# Patient Record
Sex: Female | Born: 1950
Health system: Southern US, Community
[De-identification: ages and names within clinical notes are randomized; demographics above are authoritative.]

## PROBLEM LIST (undated history)

## (undated) DIAGNOSIS — K900 Celiac disease: Secondary | ICD-10-CM

## (undated) DIAGNOSIS — E079 Disorder of thyroid, unspecified: Secondary | ICD-10-CM

---

## 2010-05-01 ENCOUNTER — Encounter: Admission: RE | Admit: 2010-05-01 | Discharge: 2010-05-01 | Payer: Self-pay | Admitting: *Deleted

## 2013-01-23 ENCOUNTER — Emergency Department (HOSPITAL_COMMUNITY)
Admission: EM | Admit: 2013-01-23 | Discharge: 2013-01-23 | Disposition: A | Discharge status: Home / Self Care | Payer: No Typology Code available for payment source | Source: Home / Self Care

## 2013-01-23 ENCOUNTER — Encounter (HOSPITAL_COMMUNITY): Payer: Self-pay | Admitting: Emergency Medicine

## 2013-01-23 DIAGNOSIS — Z23 Encounter for immunization: Secondary | ICD-10-CM

## 2013-01-23 DIAGNOSIS — IMO0002 Reserved for concepts with insufficient information to code with codable children: Secondary | ICD-10-CM

## 2013-01-23 DIAGNOSIS — T148XXA Other injury of unspecified body region, initial encounter: Secondary | ICD-10-CM

## 2013-01-23 HISTORY — DX: Disorder of thyroid, unspecified: E07.9

## 2013-01-23 HISTORY — DX: Celiac disease: K90.0

## 2013-01-23 MED ORDER — SODIUM BICARBONATE 4 % IV SOLN
INTRAVENOUS | Status: AC
  Filled 2013-01-23: qty 5

## 2013-01-23 MED ORDER — TETANUS-DIPHTH-ACELL PERTUSSIS 5-2.5-18.5 LF-MCG/0.5 IM SUSP
0.5000 mL | Freq: Once | INTRAMUSCULAR | Status: AC
  Administered 2013-01-23: 0.5 mL via INTRAMUSCULAR

## 2013-01-23 MED ORDER — HYDROCODONE-ACETAMINOPHEN 5-325 MG PO TABS: ORAL_TABLET | ORAL | Status: DC

## 2013-01-23 MED ORDER — TETANUS-DIPHTH-ACELL PERTUSSIS 5-2.5-18.5 LF-MCG/0.5 IM SUSP
INTRAMUSCULAR | Status: AC
  Filled 2013-01-23: qty 0.5

## 2013-01-23 NOTE — ED Notes (Signed)
Pt c/o straight laceration to left thumb onset 40 minutes ago Reports she was cleaning her blender when she cut her thumb w/the blade Pt is still bleeding; last tetanus unknown; she believes it could be w/in 5 yrs Alert w/no signs of acute distress... Pt soaking thumb in NS w/betadine.  Granddaughter in room w/pt.

## 2013-01-23 NOTE — ED Provider Notes (Signed)
Chief Complaint:   Chief Complaint  Patient presents with  . Extremity Laceration    History of Present Illness:   Lindsay Munoz is a 62-year-old female who lacerated her left thumb today on an emergent bladder wall trying to make a special coughing. The laceration is located near the base of the thumb, between the MCP and IP joints. If bleeding profusely. She denies any numbness of the tip of the thumb. She's able to move all of her joints well. She cannot recall when her last shot was.  Review of Systems:  Other than noted above, the patient denies any of the following symptoms: Systemic:  No fever or chills. Musculoskeletal:  No joint pain or decreased range of motion. Neuro:  No numbness, tingling, or weakness.  PMFSH:  Past medical history, family history, social history, meds, and allergies were reviewed. She is allergic to penicillin. She takes Synthroid for autoimmune hypothyroidism and also has autoimmune celiac disease.  Physical Exam:   Vital signs:  BP 122/82  Pulse 60  Temp(Src) 98.2 F (36.8 C) (Oral)  Resp 16  SpO2 99% Ext:  There is a 1.5 cm laceration on the lateral aspect of the left thumb between the MCP joint and the IP joint. She is able to move all joints well and has intact sensation of the tip of the thumb with good capillary refill.  All other joints had a full ROM without pain.  Pulses were full.  Good capillary refill in all digits.  No edema. Neurological:  Alert and oriented.  No muscle weakness.  Sensation was intact to light touch.   Procedure: Verbal informed consent was obtained.  The patient was informed of the risks and benefits of the procedure and understands and accepts.  Identity of the patient was verified verbally and by wristband.   The laceration area described above was prepped with Betadine and saline  and anesthetized with a digital block with 5 mL of 2% Xylocaine without epinephrine as been buffered with 0.5 mL of sodium bicarbonate.  The  wound was then closed as follows:  Skin edges were approximated with 3 5-0 nylon sutures.  There were no immediate complications, and the patient tolerated the procedure well. The laceration was then cleansed, Bacitracin ointment was applied and a clean, dry pressure dressing was put on.   Course in Urgent Care Center:   Given a Tdap vaccine  Assessment:  The encounter diagnosis was Laceration.  Plan:   1.  The following meds were prescribed:   Discharge Medication List as of 01/23/2013  9:55 AM    START taking these medications   Details  HYDROcodone-acetaminophen (NORCO/VICODIN) 5-325 MG per tablet 1 to 2 tabs every 4 to 6 hours as needed for pain., Print       2.  The patient was instructed in wound care and pain control, and handouts were given. 3.  The patient was told to return in 14 days for suture removal or wound recheck or sooner if any sign of infection.     Reuben Likes, MD 01/23/13 1120

## 2013-02-13 ENCOUNTER — Emergency Department (INDEPENDENT_AMBULATORY_CARE_PROVIDER_SITE_OTHER)
Admission: EM | Admit: 2013-02-13 | Discharge: 2013-02-13 | Disposition: A | Discharge status: Home / Self Care | Payer: No Typology Code available for payment source | Source: Home / Self Care | Attending: Family Medicine | Admitting: Family Medicine

## 2013-02-13 ENCOUNTER — Encounter (HOSPITAL_COMMUNITY): Payer: Self-pay | Admitting: Emergency Medicine

## 2013-02-13 DIAGNOSIS — M659 Synovitis and tenosynovitis, unspecified: Secondary | ICD-10-CM

## 2013-02-13 DIAGNOSIS — M65839 Other synovitis and tenosynovitis, unspecified forearm: Secondary | ICD-10-CM

## 2013-02-13 NOTE — ED Notes (Signed)
Pt is here to have wound checked Seen here on 8/16 and some stitches placed... Cut her thumb w/a knife Sxs today include: swelling, tenderness, redness Denies: fevers, drainage Alert w/no signs of acute distress.

## 2013-02-13 NOTE — ED Provider Notes (Signed)
Lindsay Munoz is a 62 y.o. female who presents to Urgent Care today for left thumb lacerations followup. Patient suffered a laceration to the radial aspect of her left thumb at the DIP joint about 3 weeks ago. It was repaired in her clinic. Patient had her stitches removed 2 weeks after the injury. She notes some puffiness surrounding the area of the laceration. Additionally she notes IP pain when she gets her thumb on an object. She denies any radiating pain weakness or numbness. He feels well otherwise. She has no trouble with hand motion. She's not tried any medications.    Past Medical History  Diagnosis Date  . Celiac disease   . Thyroid disease    History  Substance Use Topics  . Smoking status: Never Smoker   . Smokeless tobacco: Not on file  . Alcohol Use: Yes   ROS as above Medications reviewed. No current facility-administered medications for this encounter.   Current Outpatient Prescriptions  Medication Sig Dispense Refill  . HYDROcodone-acetaminophen (NORCO/VICODIN) 5-325 MG per tablet 1 to 2 tabs every 4 to 6 hours as needed for pain.  20 tablet  0  . levothyroxine (SYNTHROID, LEVOTHROID) 88 MCG tablet Take 88 mcg by mouth daily before breakfast.        Exam:  BP 110/79  Pulse 67  Temp(Src) 98.5 F (36.9 C) (Oral)  Resp 16  SpO2 98% Gen: Well NAD LEFT THUMB: Immature appearing scar on the radial aspect of her IP. The joint is nontender with normal motion capillary refill and sensation are intact distally. Skin slightly puffy and slightly erythematous but not particularly tender.  No pus.   No results found for this or any previous visit (from the past 24 hour(s)). No results found.  Assessment and Plan: 62 y.o. female with maturing scar with mild synovitis of the left thumb IP joint.  No signs of infection.  Reassurance and watchful waiting.  Vitamin E. for scar maturation.  Followup with hand therapy if not improving Discussed warning signs or symptoms.  Please see discharge instructions. Patient expresses understanding.      Rodolph Bong, MD 02/13/13 [phone]

## 2013-07-16 ENCOUNTER — Telehealth: Payer: Self-pay | Admitting: Hematology and Oncology

## 2013-07-16 ENCOUNTER — Telehealth: Payer: Self-pay | Admitting: Internal Medicine

## 2013-07-16 NOTE — Telephone Encounter (Signed)
LEFT MESSAGE FOR PATIENT TO RETURN CALL SCHEDULE NEW PATIENT APPT.

## 2013-07-16 NOTE — Telephone Encounter (Signed)
C/D 07/16/13 for appt. 07/23/13 °

## 2013-07-16 NOTE — Telephone Encounter (Signed)
S/W PT IN REF TO NP APPT. ON 07/23/13@1 :30 REFERRING DR Chalmers Cater DX-HIGH FERRITIN LEVELS MAILED NP PACKET

## 2013-07-23 ENCOUNTER — Ambulatory Visit: Payer: No Typology Code available for payment source

## 2013-07-23 ENCOUNTER — Ambulatory Visit (HOSPITAL_BASED_OUTPATIENT_CLINIC_OR_DEPARTMENT_OTHER): Payer: No Typology Code available for payment source | Admitting: Hematology and Oncology

## 2013-07-23 ENCOUNTER — Encounter: Payer: Self-pay | Admitting: Hematology and Oncology

## 2013-07-23 VITALS — BP 133/75 | HR 67 | Temp 98.1°F | Resp 18 | Ht 63.0 in | Wt 156.0 lb

## 2013-07-23 DIAGNOSIS — R5383 Other fatigue: Secondary | ICD-10-CM

## 2013-07-23 DIAGNOSIS — R5381 Other malaise: Secondary | ICD-10-CM

## 2013-07-23 DIAGNOSIS — E039 Hypothyroidism, unspecified: Secondary | ICD-10-CM

## 2013-07-24 DIAGNOSIS — R5383 Other fatigue: Secondary | ICD-10-CM | POA: Insufficient documentation

## 2013-07-24 NOTE — Progress Notes (Signed)
[facility] CONSULT NOTE  Patient Care Team: Provider Not In System as PCP - General  CHIEF COMPLAINTS/PURPOSE OF CONSULTATION:  High ferritin  HISTORY OF PRESENTING ILLNESS:  Lindsay Munoz 63 y.o. female is here because of high ferritin. This patient has background history diagnosis of celiac disease. The patient could not remember how she was diagnosed. She has been complaining of chronic fatigue. The patient has history of hypothyroidism and is on replacement therapy She has blood work checked per patient request, to have an iron level drawn and it came back at 199. She's been referred here for concern for high ferritin level. Serum iron, TIBC, transferrin and iron saturation were all within normal limits The patient does not take oral iron supplement. There were no family history of hemachromatosis.  MEDICAL HISTORY:  Past Medical History  Diagnosis Date  . Celiac disease   . Thyroid disease     SURGICAL HISTORY: History reviewed. No pertinent past surgical history.  SOCIAL HISTORY: History   Social History  . Marital Status: Single    Spouse Name: N/A    Number of Children: N/A  . Years of Education: N/A   Occupational History  . Not on file.   Social History Main Topics  . Smoking status: Never Smoker   . Smokeless tobacco: Never Used  . Alcohol Use: Yes  . Drug Use: No  . Sexual Activity: Yes    Birth Control/ Protection: Inserts   Other Topics Concern  . Not on file   Social History Narrative  . No narrative on file    FAMILY HISTORY: Family History  Problem Relation Age of Onset  . Cancer Mother     breast cancer  . Cancer Father     NHL  . Cancer Brother     lymphoma    ALLERGIES:  is allergic to penicillins.  MEDICATIONS:  Current Outpatient Prescriptions  Medication Sig Dispense Refill  . EVAMIST 1.53 MG/SPRAY transdermal spray       . HYDROcodone-acetaminophen (NORCO/VICODIN) 5-325 MG per tablet 1 to 2 tabs every 4  to 6 hours as needed for pain.  20 tablet  0  . levothyroxine (SYNTHROID, LEVOTHROID) 88 MCG tablet Take 88 mcg by mouth daily before breakfast.      . progesterone (PROMETRIUM) 100 MG capsule       . ADVAIR DISKUS 100-50 MCG/DOSE AEPB       . ZOVIRAX 5 %        No current facility-administered medications for this visit.    REVIEW OF SYSTEMS:   Constitutional: Denies fevers, chills or abnormal night sweats Eyes: Denies blurriness of vision, double vision or watery eyes Ears, nose, mouth, throat, and face: Denies mucositis or sore throat Respiratory: Denies cough, dyspnea or wheezes Cardiovascular: Denies palpitation, chest discomfort or lower extremity swelling Gastrointestinal:  Denies nausea, heartburn or change in bowel habits Skin: Denies abnormal skin rashes Lymphatics: Denies new lymphadenopathy or easy bruising Neurological:Denies numbness, tingling or new weaknesses Behavioral/Psych: Mood is stable, no new changes  All other systems were reviewed with the patient and are negative.  PHYSICAL EXAMINATION: ECOG PERFORMANCE STATUS: 0 - Asymptomatic  Filed Vitals:   07/23/13 1352  BP: 133/75  Pulse: 67  Temp: 98.1 F (36.7 C)  Resp: 18   Filed Weights   07/23/13 1352  Weight: 156 lb (70.761 kg)    GENERAL:alert, no distress and comfortable SKIN: skin color, texture, turgor are normal, no rashes or significant lesions EYES:  normal, conjunctiva are pink and non-injected, sclera clear OROPHARYNX:no exudate, no erythema and lips, buccal mucosa, and tongue normal  NECK: supple, thyroid normal size, non-tender, without nodularity LYMPH:  no palpable lymphadenopathy in the cervical, axillary or inguinal LUNGS: clear to auscultation and percussion with normal breathing effort HEART: regular rate & rhythm and no murmurs and no lower extremity edema ABDOMEN:abdomen soft, non-tender and normal bowel sounds Musculoskeletal:no cyanosis of digits and no clubbing  PSYCH: alert &  oriented x 3 with fluent speech NEURO: no focal motor/sensory deficits  LABORATORY DATA:  I have reviewed the data as listed above No results found for this basename: WBC, HGB, HCT, MCV, PLT   No results found for this basename: NA, K, CL, CO2    ASSESSMENT & PLAN:  #1 concerns for high ferritin According to her physician's lab, serum ferritin level of 199 is considered high. In general, I would consider ferritin level high if it's more than 300. The serum iron, iron saturation, transferrin and iron binding capacity are all within normal limits. I reassured the patient that she is does not have diagnosis of iron overload or hemochromatosis. I reassured the patient that she does not need to return and she does not require any further followup in the future.  All questions were answered. The patient knows to call the clinic with any problems, questions or concerns. I spent 30 minutes counseling the patient face to face. The total time spent in the appointment was 30 minutes and more than 50% was on counseling.     [facility], [place], MD 07/24/2013 3:16 PM

## 2015-01-10 ENCOUNTER — Other Ambulatory Visit: Payer: Self-pay | Admitting: Orthopedic Surgery

## 2015-02-06 ENCOUNTER — Encounter (HOSPITAL_BASED_OUTPATIENT_CLINIC_OR_DEPARTMENT_OTHER): Admission: RE | Payer: Self-pay | Source: Ambulatory Visit

## 2015-02-06 ENCOUNTER — Ambulatory Visit (HOSPITAL_BASED_OUTPATIENT_CLINIC_OR_DEPARTMENT_OTHER)
Admission: RE | Admit: 2015-02-06 | Payer: No Typology Code available for payment source | Source: Ambulatory Visit | Admitting: Orthopedic Surgery

## 2015-02-06 SURGERY — RELEASE, A1 PULLEY, FOR TRIGGER FINGER
Anesthesia: Monitor Anesthesia Care | Site: Thumb | Laterality: Right

## 2017-01-30 ENCOUNTER — Ambulatory Visit (INDEPENDENT_AMBULATORY_CARE_PROVIDER_SITE_OTHER): Payer: No Typology Code available for payment source | Admitting: Podiatry

## 2017-01-30 ENCOUNTER — Encounter: Payer: Self-pay | Admitting: Podiatry

## 2017-01-30 VITALS — BP 130/74 | HR 55 | Resp 16

## 2017-01-30 DIAGNOSIS — M2041 Other hammer toe(s) (acquired), right foot: Secondary | ICD-10-CM | POA: Diagnosis not present

## 2017-01-30 DIAGNOSIS — M779 Enthesopathy, unspecified: Secondary | ICD-10-CM | POA: Diagnosis not present

## 2017-01-30 DIAGNOSIS — M21619 Bunion of unspecified foot: Secondary | ICD-10-CM | POA: Diagnosis not present

## 2017-01-30 MED ORDER — DICLOFENAC SODIUM 75 MG PO TBEC: 75.0000 mg | DELAYED_RELEASE_TABLET | Freq: Two times a day (BID) | ORAL | 2 refills | Status: AC

## 2017-01-30 NOTE — Patient Instructions (Signed)

## 2017-01-30 NOTE — Progress Notes (Signed)
   Subjective:    Patient ID: Lindsay Munoz, female    DOB: [birth year], 66 y.o.   MRN: [id number]  HPI Chief Complaint  Patient presents with  . Foot Pain    Right foot; plantar forefoot-below 2nd toe; x1 yr  . Nail Problem    Left foot; great toe-medial side; Left foot 5th toe-pt stated, "Thinks might have nail fungus and wants to have checked; is taking Ciclopirox Topical Solution"      Review of Systems  Allergic/Immunologic: Positive for food allergies.  All other systems reviewed and are negative.      Objective:   Physical Exam        Assessment & Plan:

## 2017-01-30 NOTE — Progress Notes (Signed)
Subjective:    Patient ID: Lindsay Munoz, female   DOB: 66 y.o.   MRN: [id number]   HPI patient presents with inflammation around the right second metatarsal joint of approximate year duration and ingrown toenail deformity left hallux medial border that is painful along with thickness of the fifth nails bilateral    Review of Systems  All other systems reviewed and are negative.       Objective:  Physical Exam  Constitutional: She appears well-developed and well-nourished.  Cardiovascular: Intact distal pulses.   Pulmonary/Chest: Breath sounds normal.  Musculoskeletal: Normal range of motion.  Neurological: She is alert.  Skin: Skin is warm.  Nursing note and vitals reviewed.  neurovascular status found to be intact muscle strength was adequate range of motion within normal limits with patient noted to have inflammation pain around the second MPJ right and I noted incurvated left hallux medial border. The fifth nails are also slightly thickened but that appears to be related to position and they're mildly tender when pressed. Second MPJ has been sore and I also noted structural bunion deformity     Assessment:  Probable inflammatory capsulitis second MPJ right along with ingrown toenail deformity left and damage fifth nails bilateral along with structural bunion right       Plan:   H&P conditions reviewed and discussed x-rays but patient denies wanting x-rays at the current time. I went ahead and discussed treatment and options are somewhat limited due to her not wanting me to do any types of x-rays and today I did apply metatarsal pad to diminish pressure on the joint placed her on anti-inflammatory diclofenac 75 mg twice a day educated her on an ingrown toenail correction and probable capsulitis and we will schedule for treatment in the future when it works out timewise for her

## 2017-06-11 DIAGNOSIS — J209 Acute bronchitis, unspecified: Secondary | ICD-10-CM | POA: Diagnosis not present

## 2017-06-19 DIAGNOSIS — Z01419 Encounter for gynecological examination (general) (routine) without abnormal findings: Secondary | ICD-10-CM | POA: Diagnosis not present

## 2017-06-19 DIAGNOSIS — Z6827 Body mass index (BMI) 27.0-27.9, adult: Secondary | ICD-10-CM | POA: Diagnosis not present

## 2017-06-19 DIAGNOSIS — Z124 Encounter for screening for malignant neoplasm of cervix: Secondary | ICD-10-CM | POA: Diagnosis not present

## 2017-06-19 DIAGNOSIS — Z1231 Encounter for screening mammogram for malignant neoplasm of breast: Secondary | ICD-10-CM | POA: Diagnosis not present

## 2017-07-10 DIAGNOSIS — R7309 Other abnormal glucose: Secondary | ICD-10-CM | POA: Diagnosis not present

## 2017-07-31 DIAGNOSIS — F330 Major depressive disorder, recurrent, mild: Secondary | ICD-10-CM | POA: Diagnosis not present

## 2017-08-14 DIAGNOSIS — F330 Major depressive disorder, recurrent, mild: Secondary | ICD-10-CM | POA: Diagnosis not present

## 2017-09-04 DIAGNOSIS — F330 Major depressive disorder, recurrent, mild: Secondary | ICD-10-CM | POA: Diagnosis not present

## 2017-09-04 DIAGNOSIS — F411 Generalized anxiety disorder: Secondary | ICD-10-CM | POA: Diagnosis not present

## 2017-09-11 DIAGNOSIS — F411 Generalized anxiety disorder: Secondary | ICD-10-CM | POA: Diagnosis not present

## 2017-09-11 DIAGNOSIS — F330 Major depressive disorder, recurrent, mild: Secondary | ICD-10-CM | POA: Diagnosis not present

## 2017-10-07 DIAGNOSIS — L718 Other rosacea: Secondary | ICD-10-CM | POA: Diagnosis not present

## 2017-10-07 DIAGNOSIS — Z85828 Personal history of other malignant neoplasm of skin: Secondary | ICD-10-CM | POA: Diagnosis not present

## 2017-10-07 DIAGNOSIS — I788 Other diseases of capillaries: Secondary | ICD-10-CM | POA: Diagnosis not present

## 2017-10-07 DIAGNOSIS — L918 Other hypertrophic disorders of the skin: Secondary | ICD-10-CM | POA: Diagnosis not present

## 2017-10-21 DIAGNOSIS — E039 Hypothyroidism, unspecified: Secondary | ICD-10-CM | POA: Diagnosis not present

## 2017-10-28 DIAGNOSIS — E039 Hypothyroidism, unspecified: Secondary | ICD-10-CM | POA: Diagnosis not present

## 2017-11-12 DIAGNOSIS — F330 Major depressive disorder, recurrent, mild: Secondary | ICD-10-CM | POA: Diagnosis not present

## 2017-11-28 DIAGNOSIS — R5383 Other fatigue: Secondary | ICD-10-CM | POA: Diagnosis not present

## 2017-12-02 DIAGNOSIS — Z780 Asymptomatic menopausal state: Secondary | ICD-10-CM | POA: Diagnosis not present

## 2017-12-02 DIAGNOSIS — N951 Menopausal and female climacteric states: Secondary | ICD-10-CM | POA: Diagnosis not present

## 2017-12-08 DIAGNOSIS — F330 Major depressive disorder, recurrent, mild: Secondary | ICD-10-CM | POA: Diagnosis not present

## 2018-01-01 DIAGNOSIS — F330 Major depressive disorder, recurrent, mild: Secondary | ICD-10-CM | POA: Diagnosis not present

## 2018-01-13 DIAGNOSIS — M25521 Pain in right elbow: Secondary | ICD-10-CM | POA: Diagnosis not present

## 2018-01-13 DIAGNOSIS — M20012 Mallet finger of left finger(s): Secondary | ICD-10-CM | POA: Diagnosis not present

## 2018-01-13 DIAGNOSIS — M7711 Lateral epicondylitis, right elbow: Secondary | ICD-10-CM | POA: Diagnosis not present

## 2018-01-14 DIAGNOSIS — F330 Major depressive disorder, recurrent, mild: Secondary | ICD-10-CM | POA: Diagnosis not present

## 2018-01-22 ENCOUNTER — Encounter: Payer: Self-pay | Admitting: Podiatry

## 2018-01-22 ENCOUNTER — Ambulatory Visit (INDEPENDENT_AMBULATORY_CARE_PROVIDER_SITE_OTHER): Payer: Medicare Other | Admitting: Podiatry

## 2018-01-22 DIAGNOSIS — M2041 Other hammer toe(s) (acquired), right foot: Secondary | ICD-10-CM | POA: Diagnosis not present

## 2018-01-22 DIAGNOSIS — L603 Nail dystrophy: Secondary | ICD-10-CM | POA: Diagnosis not present

## 2018-01-22 DIAGNOSIS — M779 Enthesopathy, unspecified: Secondary | ICD-10-CM | POA: Diagnosis not present

## 2018-01-22 DIAGNOSIS — M21619 Bunion of unspecified foot: Secondary | ICD-10-CM | POA: Diagnosis not present

## 2018-01-22 DIAGNOSIS — L608 Other nail disorders: Secondary | ICD-10-CM | POA: Diagnosis not present

## 2018-01-22 NOTE — Progress Notes (Signed)
Subjective: 67 year old female presents the office today primarily for concerns of toenail discoloration as well as thickening to both of her fifth digit toenails.  She states she occasionally with ingrown toenail to the big toe on the left side which is been a chronic issue but she denies any redness or drainage or any swelling.  Also she also saw Dr. Paulla Dolly previously for pain to her right foot.  Since then she also followed up with orthopedics.  She was having pain to the arch of her foot as well as in the ball of her foot.  She states that she was told not to have a steroid injection.  She did change her shoes and she did purchase an [employer] cloud shoe and when she got this her pain resolved and she is currently not expensing any pain to her right foot.  No recent injury no numbness or tingling. Denies any systemic complaints such as fevers, chills, nausea, vomiting. No acute changes since last appointment, and no other complaints at this time.   Objective: AAO x3, NAD DP/PT pulses palpable bilaterally, CRT less than 3 seconds The nails appear to be hypertrophic, dystrophic, discolored mostly to the fifth digit toenails however all the nails are slightly discolored yellow to brown discoloration.  There is mild tenderness palpation of the fifth toenails given the thickening but there is no edema, erythema, drainage or pus there is no clinical signs of infection noted today.  Mild incurvation on both medial lateral corners of bilateral hallux nails without any signs or symptoms of infection no pain.  The nails are uneven. Bunion deformities present bilaterally as well as slight medial deviation of the second toe upon weightbearing.  There is no area tenderness to palpation of this area there is no pain or crepitation with MPJ range of motion.  No pain no plantar fascial, Achilles tendon.  No edema, erythema. No open lesions or pre-ulcerative lesions.  No pain with calf compression, swelling, warmth,  erythema  Assessment: Onychodystrophy, likely onychomycosis with ingrowing toenails; resolved right foot pain.  Plan: -All treatment options discussed with the patient including all alternatives, risks, complications.  -We long discussion today in regards to treatment of her foot pain.  Her pain seems to be resolved the change in shoes we discussed continue with supportive shoes.  We will hold off on x-rays today at her request.  We will continue to monitor. -Today we discussed treatment options for likely nail fungus however before proceeding with definitive treatment I did culture the nails today.  Debrided all the nails without any complications I sent this for culture/pathology to evaluate for nail fungus. Specimen was given to Cranford Mon, CMA.  -Patient encouraged to call the office with any questions, concerns, change in symptoms.   Trula Slade DPM

## 2018-01-22 NOTE — Addendum Note (Signed)
Addended by: Graceann Congress D on: 01/22/2018 12:11 PM   Modules accepted: Orders

## 2018-01-30 DIAGNOSIS — E039 Hypothyroidism, unspecified: Secondary | ICD-10-CM | POA: Diagnosis not present

## 2018-01-30 DIAGNOSIS — E559 Vitamin D deficiency, unspecified: Secondary | ICD-10-CM | POA: Diagnosis not present

## 2018-01-30 DIAGNOSIS — H539 Unspecified visual disturbance: Secondary | ICD-10-CM | POA: Diagnosis not present

## 2018-01-30 DIAGNOSIS — H9192 Unspecified hearing loss, left ear: Secondary | ICD-10-CM | POA: Diagnosis not present

## 2018-01-30 DIAGNOSIS — Z0000 Encounter for general adult medical examination without abnormal findings: Secondary | ICD-10-CM | POA: Diagnosis not present

## 2018-01-30 DIAGNOSIS — Z136 Encounter for screening for cardiovascular disorders: Secondary | ICD-10-CM | POA: Diagnosis not present

## 2018-01-30 DIAGNOSIS — R7301 Impaired fasting glucose: Secondary | ICD-10-CM | POA: Diagnosis not present

## 2018-01-30 DIAGNOSIS — Z79899 Other long term (current) drug therapy: Secondary | ICD-10-CM | POA: Diagnosis not present

## 2018-02-06 DIAGNOSIS — Z23 Encounter for immunization: Secondary | ICD-10-CM | POA: Diagnosis not present

## 2018-02-10 DIAGNOSIS — M25521 Pain in right elbow: Secondary | ICD-10-CM | POA: Diagnosis not present

## 2018-02-10 DIAGNOSIS — M20012 Mallet finger of left finger(s): Secondary | ICD-10-CM | POA: Diagnosis not present

## 2018-02-11 ENCOUNTER — Telehealth: Payer: Self-pay | Admitting: *Deleted

## 2018-02-11 NOTE — Telephone Encounter (Signed)
Called and left a message for the patient to call me back about the nail culture at [phone]. Lindsay Munoz

## 2018-02-13 DIAGNOSIS — M542 Cervicalgia: Secondary | ICD-10-CM | POA: Diagnosis not present

## 2018-02-19 ENCOUNTER — Telehealth: Payer: Self-pay | Admitting: Podiatry

## 2018-02-19 NOTE — Telephone Encounter (Signed)
Pt was returning a phone call to [name]. Please give her a call back

## 2018-02-19 NOTE — Telephone Encounter (Signed)
Pt never received her test results.

## 2018-02-20 ENCOUNTER — Telehealth: Payer: Self-pay | Admitting: *Deleted

## 2018-02-20 NOTE — Telephone Encounter (Signed)
Called and left a message for the patient to call me back. Lattie Haw

## 2018-02-23 DIAGNOSIS — F330 Major depressive disorder, recurrent, mild: Secondary | ICD-10-CM | POA: Diagnosis not present

## 2018-03-05 DIAGNOSIS — R194 Change in bowel habit: Secondary | ICD-10-CM | POA: Diagnosis not present

## 2018-03-05 DIAGNOSIS — K600 Acute anal fissure: Secondary | ICD-10-CM | POA: Diagnosis not present

## 2018-03-05 DIAGNOSIS — K5900 Constipation, unspecified: Secondary | ICD-10-CM | POA: Diagnosis not present

## 2018-03-05 DIAGNOSIS — K625 Hemorrhage of anus and rectum: Secondary | ICD-10-CM | POA: Diagnosis not present

## 2018-03-25 DIAGNOSIS — L918 Other hypertrophic disorders of the skin: Secondary | ICD-10-CM | POA: Diagnosis not present

## 2018-03-25 DIAGNOSIS — F330 Major depressive disorder, recurrent, mild: Secondary | ICD-10-CM | POA: Diagnosis not present

## 2018-03-25 DIAGNOSIS — Z85828 Personal history of other malignant neoplasm of skin: Secondary | ICD-10-CM | POA: Diagnosis not present

## 2018-03-25 DIAGNOSIS — L738 Other specified follicular disorders: Secondary | ICD-10-CM | POA: Diagnosis not present

## 2018-03-25 DIAGNOSIS — L821 Other seborrheic keratosis: Secondary | ICD-10-CM | POA: Diagnosis not present

## 2018-03-31 DIAGNOSIS — D259 Leiomyoma of uterus, unspecified: Secondary | ICD-10-CM | POA: Diagnosis not present

## 2018-03-31 DIAGNOSIS — R1032 Left lower quadrant pain: Secondary | ICD-10-CM | POA: Diagnosis not present

## 2018-04-01 DIAGNOSIS — F419 Anxiety disorder, unspecified: Secondary | ICD-10-CM | POA: Diagnosis not present

## 2018-04-03 DIAGNOSIS — Z23 Encounter for immunization: Secondary | ICD-10-CM | POA: Diagnosis not present

## 2018-04-08 DIAGNOSIS — F330 Major depressive disorder, recurrent, mild: Secondary | ICD-10-CM | POA: Diagnosis not present

## 2018-04-28 DIAGNOSIS — E039 Hypothyroidism, unspecified: Secondary | ICD-10-CM | POA: Diagnosis not present

## 2018-05-04 DIAGNOSIS — F330 Major depressive disorder, recurrent, mild: Secondary | ICD-10-CM | POA: Diagnosis not present

## 2018-05-05 DIAGNOSIS — E039 Hypothyroidism, unspecified: Secondary | ICD-10-CM | POA: Diagnosis not present

## 2018-05-14 DIAGNOSIS — M20012 Mallet finger of left finger(s): Secondary | ICD-10-CM | POA: Diagnosis not present

## 2018-05-14 DIAGNOSIS — M13842 Other specified arthritis, left hand: Secondary | ICD-10-CM | POA: Diagnosis not present

## 2018-05-14 DIAGNOSIS — M25521 Pain in right elbow: Secondary | ICD-10-CM | POA: Diagnosis not present

## 2018-05-27 DIAGNOSIS — M20012 Mallet finger of left finger(s): Secondary | ICD-10-CM | POA: Diagnosis not present

## 2018-06-29 DIAGNOSIS — Z6826 Body mass index (BMI) 26.0-26.9, adult: Secondary | ICD-10-CM | POA: Diagnosis not present

## 2018-06-29 DIAGNOSIS — Z01419 Encounter for gynecological examination (general) (routine) without abnormal findings: Secondary | ICD-10-CM | POA: Diagnosis not present

## 2018-06-29 DIAGNOSIS — Z1231 Encounter for screening mammogram for malignant neoplasm of breast: Secondary | ICD-10-CM | POA: Diagnosis not present

## 2018-07-03 ENCOUNTER — Encounter: Payer: Self-pay | Admitting: Podiatry

## 2018-07-03 ENCOUNTER — Ambulatory Visit (INDEPENDENT_AMBULATORY_CARE_PROVIDER_SITE_OTHER): Payer: Medicare Other | Admitting: Podiatry

## 2018-07-03 DIAGNOSIS — M21619 Bunion of unspecified foot: Secondary | ICD-10-CM

## 2018-07-03 NOTE — Progress Notes (Signed)
This patient presents to the office with severe pain noted at the bunion left big toe.  She says she has developed a painful skin lesion at the site of the bunion.  She says this skin lesion has increased in severity the last 2 months.  She says certain shoes aggrevate this skin lesion.  She presents to the office for evaluation and treatment.  General Appearance  Alert, conversant and in no acute stress.  Vascular  Dorsalis pedis and posterior tibial  pulses are palpable  bilaterally.  Capillary return is within normal limits  bilaterally. Temperature is within normal limits  bilaterally.  Neurologic  Senn-Weinstein monofilament wire test within normal limits  bilaterally. Muscle power within normal limits bilaterally.  Nails Thick disfigured discolored nails with subungual debris  from hallux to fifth toes bilaterally. No evidence of bacterial infection or drainage bilaterally.  Orthopedic  No limitations of motion  feet .  No crepitus or effusions noted.  No bony pathology or digital deformities noted. HAV 1st MPJ  B/L.Hammer toe 2nd  B/L.  Skin  normotropic skin  noted bilaterally.  No signs of infections or ulcers noted.  Porokeratosis at dorsomedial exostosis left big toe bunion.  Porokeratosis  HAV 1st MPJ  B/L  ROV>  Debride porokeratosis.  Discussed footgear with this patient.  Recommended usage of pumice stone.  RTC prn   Gardiner Barefoot DPM

## 2018-07-06 DIAGNOSIS — Z85828 Personal history of other malignant neoplasm of skin: Secondary | ICD-10-CM | POA: Diagnosis not present

## 2018-07-06 DIAGNOSIS — D2239 Melanocytic nevi of other parts of face: Secondary | ICD-10-CM | POA: Diagnosis not present

## 2018-07-06 DIAGNOSIS — L918 Other hypertrophic disorders of the skin: Secondary | ICD-10-CM | POA: Diagnosis not present

## 2018-07-14 DIAGNOSIS — Z780 Asymptomatic menopausal state: Secondary | ICD-10-CM | POA: Diagnosis not present

## 2018-07-17 DIAGNOSIS — F411 Generalized anxiety disorder: Secondary | ICD-10-CM | POA: Diagnosis not present

## 2018-08-11 ENCOUNTER — Ambulatory Visit: Payer: Medicare Other | Admitting: Podiatry

## 2018-08-13 DIAGNOSIS — K580 Irritable bowel syndrome with diarrhea: Secondary | ICD-10-CM | POA: Diagnosis not present

## 2018-08-13 DIAGNOSIS — K641 Second degree hemorrhoids: Secondary | ICD-10-CM | POA: Diagnosis not present

## 2018-08-14 ENCOUNTER — Encounter: Payer: Self-pay | Admitting: Podiatry

## 2018-08-14 ENCOUNTER — Ambulatory Visit (INDEPENDENT_AMBULATORY_CARE_PROVIDER_SITE_OTHER): Payer: Medicare Other | Admitting: Podiatry

## 2018-08-14 DIAGNOSIS — L84 Corns and callosities: Secondary | ICD-10-CM | POA: Diagnosis not present

## 2018-08-14 NOTE — Progress Notes (Signed)
This patient presents to the office with severe pain noted at the  The outside of her fifth toe right foot.  She says this area becomes painful.  She says she aggravates the toe wearing her shoes.  She points to the site of hyperkeratotic lesion.  She presents to the office for evaluation and treatment.  General Appearance  Alert, conversant and in no acute stress.  Vascular  Dorsalis pedis and posterior tibial  pulses are palpable  bilaterally.  Capillary return is within normal limits  bilaterally. Temperature is within normal limits  bilaterally.  Neurologic  Senn-Weinstein monofilament wire test within normal limits  bilaterally. Muscle power within normal limits bilaterally.  Nails Thick disfigured discolored nails with subungual debris  from hallux to fifth toes bilaterally. No evidence of bacterial infection or drainage bilaterally.  Orthopedic  No limitations of motion  feet .  No crepitus or effusions noted.  No bony pathology or digital deformities noted. HAV 1st MPJ  B/L.Hammer toe 2nd  B/L.  Skin  normotropic skin  noted bilaterally.  No signs of infections or ulcers noted.  Porokeratosis at dorsomedial exostosis left big toe bunion asymptomatic.  Pain at outside fifth toe right foot.  Corn fifth toe right foot.  ROV>  Debride porokeratosis.  Padding dispensed.    RTC prn   Gardiner Barefoot DPM

## 2018-08-15 ENCOUNTER — Ambulatory Visit: Payer: Medicare Other | Admitting: Podiatry

## 2018-08-18 DIAGNOSIS — N950 Postmenopausal bleeding: Secondary | ICD-10-CM | POA: Diagnosis not present

## 2018-08-24 ENCOUNTER — Other Ambulatory Visit: Payer: Self-pay

## 2018-08-24 ENCOUNTER — Ambulatory Visit (INDEPENDENT_AMBULATORY_CARE_PROVIDER_SITE_OTHER): Payer: Medicare Other

## 2018-08-24 ENCOUNTER — Ambulatory Visit (INDEPENDENT_AMBULATORY_CARE_PROVIDER_SITE_OTHER): Payer: Medicare Other | Admitting: Podiatry

## 2018-08-24 ENCOUNTER — Encounter: Payer: Self-pay | Admitting: Podiatry

## 2018-08-24 DIAGNOSIS — M79671 Pain in right foot: Secondary | ICD-10-CM

## 2018-08-24 DIAGNOSIS — T148XXA Other injury of unspecified body region, initial encounter: Secondary | ICD-10-CM

## 2018-08-25 ENCOUNTER — Other Ambulatory Visit: Payer: Self-pay | Admitting: Podiatry

## 2018-08-25 DIAGNOSIS — T148XXA Other injury of unspecified body region, initial encounter: Secondary | ICD-10-CM

## 2018-08-25 NOTE — Progress Notes (Signed)
Subjective: 68 year old female presents the office today for concerns of right foot pain which is been ongoing for the last 3 weeks.  She states that she recently dropped a cutting board on top of her foot.  Since then she is having achy pain to the top of her foot she points more to the dorsal lateral aspect of the foot.  She said no recent treatment.  She states that wearing shoes without support and that were noticed to have caused increased pain when she started wearing her hiking boots this felt better. Denies any systemic complaints such as fevers, chills, nausea, vomiting. No acute changes since last appointment, and no other complaints at this time.   Objective: AAO x3, NAD DP/PT pulses palpable bilaterally, CRT less than 3 seconds There is no significant edema to the right foot there is no open sores.  There is tenderness to palpation directly the fourth metatarsal.  There is no other areas of point tenderness.  Flexor, extensor tendons all appear to be intact. No open lesions or pre-ulcerative lesions.  No pain with calf compression, swelling, warmth, erythema  Assessment: Bone contusion right fourth metatarsal  Plan: -All treatment options discussed with the patient including all alternatives, risks, complications.  -X-rays were obtained and reviewed.  There is some radiolucency, swelling present of the fourth metatarsal consistent with either bone bruise versus healing fracture. -Given her pain as well as radiological findings of medical history, surgical shoe.  This was dispensed today.  Ice and elevation.  Anti-inflammatories as needed. -Patient encouraged to call the office with any questions, concerns, change in symptoms.   Return in about 3 weeks (around 09/14/2018).  Repeat x-rays appointment  Trula Slade DPM

## 2018-08-26 ENCOUNTER — Ambulatory Visit: Payer: Medicare Other | Admitting: Podiatry

## 2018-08-27 DIAGNOSIS — N950 Postmenopausal bleeding: Secondary | ICD-10-CM | POA: Diagnosis not present

## 2018-08-27 DIAGNOSIS — Z780 Asymptomatic menopausal state: Secondary | ICD-10-CM | POA: Diagnosis not present

## 2018-09-14 ENCOUNTER — Encounter: Payer: Self-pay | Admitting: Podiatry

## 2018-09-14 ENCOUNTER — Ambulatory Visit (INDEPENDENT_AMBULATORY_CARE_PROVIDER_SITE_OTHER): Payer: Medicare Other

## 2018-09-14 ENCOUNTER — Other Ambulatory Visit: Payer: Self-pay

## 2018-09-14 ENCOUNTER — Ambulatory Visit (INDEPENDENT_AMBULATORY_CARE_PROVIDER_SITE_OTHER): Payer: Medicare Other | Admitting: Podiatry

## 2018-09-14 VITALS — Temp 98.2°F

## 2018-09-14 DIAGNOSIS — T148XXA Other injury of unspecified body region, initial encounter: Secondary | ICD-10-CM

## 2018-09-14 DIAGNOSIS — L600 Ingrowing nail: Secondary | ICD-10-CM

## 2018-09-14 MED ORDER — DICLOFENAC SODIUM 1 % TD GEL: 2.0000 g | Freq: Four times a day (QID) | TRANSDERMAL | 2 refills | Status: DC

## 2018-09-15 DIAGNOSIS — L821 Other seborrheic keratosis: Secondary | ICD-10-CM | POA: Diagnosis not present

## 2018-09-15 DIAGNOSIS — Z85828 Personal history of other malignant neoplasm of skin: Secondary | ICD-10-CM | POA: Diagnosis not present

## 2018-09-17 DIAGNOSIS — F411 Generalized anxiety disorder: Secondary | ICD-10-CM | POA: Diagnosis not present

## 2018-09-17 NOTE — Progress Notes (Signed)
Subjective: 68 year old female presents the office today for follow evaluation of right foot pain.  She said overall is doing okay.  When she wears hiking boots she has no pain but when she tries to wear small heel or very flexible shoe she gets discomfort.  No increase in swelling and no redness or warmth.  No new injury since I last saw her.  Also she wants me to look at her left big toe.  She usually gets pedicures but given the recent pandemic she cannot get them and she wants me to check to see if she is getting ingrown toenail.  She has no pain to the toe and she denies any edema, erythema or any drainage or pus or any signs of infection. Denies any systemic complaints such as fevers, chills, nausea, vomiting. No acute changes since last appointment, and no other complaints at this time.   Objective: AAO x3, NAD DP/PT pulses palpable bilaterally, CRT less than 3 seconds Minimal incurvation present left hallux toenail on the medial aspect of where she had concerns.  There is no edema, erythema, increase in warmth.  There is no drainage or pus identified at this time. At this time there is minimal discomfort to the right foot.  Majority tenderness is on the fourth metatarsal area there is no pain to vibratory sensation to the foot there is no other area pinpoint tenderness.  There is no significant edema, erythema. No open lesions or pre-ulcerative lesions.  No pain with calf compression, swelling, warmth, erythema  Assessment: Right foot bone contusion, left mild ingrown toenail  Plan: -All treatment options discussed with the patient including all alternatives, risks, complications.  -Repeat x-rays were obtained and reviewed today.  No evidence of acute fracture or stress fracture identified. -Overall she is doing better.  She still difficulty when wearing certain shoes and she brought in several pairs of shoes.  The shoes on her arm.  Sneakers are extremely flexible as well as a shoe with a  heel.  Her hiking boots and stiffer soled shoes cause no pain.  We discussed not wearing stiffer soled shoe and continue ice.  Prescribed Voltaren gel as well.  She will try to hold off on oral medications. -In regards to the left hallux toenail there is no signs of infection minimal pain.  I did debride the toenail with any complications or bleeding neck of the nail straight across as it was elongated.  Discussed signs and symptoms of infection as well as of the ingrown toenail becomes worse she may need to have it removed.  She does not want the procedure.  Trula Slade DPM

## 2018-09-24 DIAGNOSIS — N950 Postmenopausal bleeding: Secondary | ICD-10-CM | POA: Diagnosis not present

## 2018-10-09 DIAGNOSIS — F411 Generalized anxiety disorder: Secondary | ICD-10-CM | POA: Diagnosis not present

## 2018-10-12 ENCOUNTER — Ambulatory Visit: Payer: Medicare Other | Admitting: Podiatry

## 2018-10-15 DIAGNOSIS — M65312 Trigger thumb, left thumb: Secondary | ICD-10-CM | POA: Diagnosis not present

## 2018-10-15 DIAGNOSIS — M1812 Unilateral primary osteoarthritis of first carpometacarpal joint, left hand: Secondary | ICD-10-CM | POA: Diagnosis not present

## 2018-10-15 DIAGNOSIS — M20012 Mallet finger of left finger(s): Secondary | ICD-10-CM | POA: Diagnosis not present

## 2018-10-15 DIAGNOSIS — M25521 Pain in right elbow: Secondary | ICD-10-CM | POA: Diagnosis not present

## 2018-10-19 ENCOUNTER — Ambulatory Visit: Payer: Medicare Other | Admitting: Podiatry

## 2018-10-22 DIAGNOSIS — F411 Generalized anxiety disorder: Secondary | ICD-10-CM | POA: Diagnosis not present

## 2018-10-30 ENCOUNTER — Ambulatory Visit: Payer: Medicare Other | Admitting: Podiatry

## 2018-11-03 DIAGNOSIS — E039 Hypothyroidism, unspecified: Secondary | ICD-10-CM | POA: Diagnosis not present

## 2018-11-05 DIAGNOSIS — N950 Postmenopausal bleeding: Secondary | ICD-10-CM | POA: Diagnosis not present

## 2018-11-09 DIAGNOSIS — Z03818 Encounter for observation for suspected exposure to other biological agents ruled out: Secondary | ICD-10-CM | POA: Diagnosis not present

## 2018-11-09 DIAGNOSIS — E039 Hypothyroidism, unspecified: Secondary | ICD-10-CM | POA: Diagnosis not present

## 2018-11-18 DIAGNOSIS — F411 Generalized anxiety disorder: Secondary | ICD-10-CM | POA: Diagnosis not present

## 2018-11-27 ENCOUNTER — Ambulatory Visit: Payer: Medicare Other | Admitting: Podiatry

## 2018-12-10 DIAGNOSIS — D251 Intramural leiomyoma of uterus: Secondary | ICD-10-CM | POA: Diagnosis not present

## 2018-12-10 DIAGNOSIS — N83202 Unspecified ovarian cyst, left side: Secondary | ICD-10-CM | POA: Diagnosis not present

## 2018-12-10 DIAGNOSIS — N83201 Unspecified ovarian cyst, right side: Secondary | ICD-10-CM | POA: Diagnosis not present

## 2018-12-17 DIAGNOSIS — M65312 Trigger thumb, left thumb: Secondary | ICD-10-CM | POA: Diagnosis not present

## 2018-12-25 DIAGNOSIS — F411 Generalized anxiety disorder: Secondary | ICD-10-CM | POA: Diagnosis not present

## 2019-01-04 ENCOUNTER — Ambulatory Visit (INDEPENDENT_AMBULATORY_CARE_PROVIDER_SITE_OTHER): Payer: Medicare Other | Admitting: Podiatry

## 2019-01-04 ENCOUNTER — Other Ambulatory Visit: Payer: Self-pay

## 2019-01-04 VITALS — Temp 97.8°F

## 2019-01-04 DIAGNOSIS — L600 Ingrowing nail: Secondary | ICD-10-CM

## 2019-01-04 DIAGNOSIS — M779 Enthesopathy, unspecified: Secondary | ICD-10-CM

## 2019-01-04 NOTE — Progress Notes (Signed)
Subjective: 68 year old female presents the office today for follow-up evaluation of right foot pain.  She states that the pain is what shoes she wears she notices some inflammation to the right foot she points on the dorsal lateral aspect of the foot along the sinus tarsi area.She states that when she wears more supportive shoes she has no pain but when she wears flexible shoes when she gets discomfort.  She is currently denies any pain.  She is also inquiring about ingrown toenail to left big toe, medial nail border.  She states that she gets pedicures frequently.  She states that getting the nail out does help.  No redness or drainage.  Denies any systemic complaints such as fevers, chills, nausea, vomiting. No acute changes since last appointment, and no other complaints at this time.   Objective: AAO x3, NAD DP/PT pulses palpable bilaterally, CRT less than 3 seconds At this time unable to elicit any tenderness of the right foot there is no edema, erythema.  Subjectively on the course of the sinus tarsi along the extensor digitorum brevis muscle belly is where she gets some discomfort but not able to elicit any tenderness today.  No edema, erythema.  The pain that she is having to the metatarsals has resolved.  On the left hallux there is mild incurvation present on the medial nail border proximally but there is no pain today and there is no edema, erythema.  No open lesions or pre-ulcerative lesions.  No pain with calf compression, swelling, warmth, erythema  Assessment: Right foot capsulitis, left foot ingrown toenail  Plan: -All treatment options discussed with the patient including all alternatives, risks, complications.  -In regards to the right foot pain we discussed continue with the Voltaren gel.  She had questions on how to apply this.  She is only been using it very intermittently as she is been doing better.  Application instructions  Discussed. we discussed changing shoes at the past  with the most helpful thing for her. -Discussed partial nail avulsion with chemical matricectomy to the left hallux which she was to hold off on this today.  Monitor for any signs or symptoms of infection. -Patient encouraged to call the office with any questions, concerns, change in symptoms.   Trula Slade DPM

## 2019-01-18 DIAGNOSIS — F411 Generalized anxiety disorder: Secondary | ICD-10-CM | POA: Diagnosis not present

## 2019-02-02 DIAGNOSIS — F411 Generalized anxiety disorder: Secondary | ICD-10-CM | POA: Diagnosis not present

## 2019-02-05 DIAGNOSIS — M545 Low back pain, unspecified: Secondary | ICD-10-CM | POA: Insufficient documentation

## 2019-02-05 DIAGNOSIS — G8929 Other chronic pain: Secondary | ICD-10-CM | POA: Insufficient documentation

## 2019-02-09 DIAGNOSIS — M545 Low back pain: Secondary | ICD-10-CM | POA: Diagnosis not present

## 2019-02-12 DIAGNOSIS — M545 Low back pain: Secondary | ICD-10-CM | POA: Diagnosis not present

## 2019-02-16 DIAGNOSIS — M545 Low back pain: Secondary | ICD-10-CM | POA: Diagnosis not present

## 2019-02-18 DIAGNOSIS — F411 Generalized anxiety disorder: Secondary | ICD-10-CM | POA: Diagnosis not present

## 2019-02-20 DIAGNOSIS — M545 Low back pain: Secondary | ICD-10-CM | POA: Diagnosis not present

## 2019-02-23 DIAGNOSIS — M545 Low back pain: Secondary | ICD-10-CM | POA: Diagnosis not present

## 2019-02-26 DIAGNOSIS — E559 Vitamin D deficiency, unspecified: Secondary | ICD-10-CM | POA: Diagnosis not present

## 2019-02-26 DIAGNOSIS — F419 Anxiety disorder, unspecified: Secondary | ICD-10-CM | POA: Diagnosis not present

## 2019-02-26 DIAGNOSIS — J45909 Unspecified asthma, uncomplicated: Secondary | ICD-10-CM | POA: Diagnosis not present

## 2019-02-26 DIAGNOSIS — R7301 Impaired fasting glucose: Secondary | ICD-10-CM | POA: Diagnosis not present

## 2019-02-26 DIAGNOSIS — Z79899 Other long term (current) drug therapy: Secondary | ICD-10-CM | POA: Diagnosis not present

## 2019-02-26 DIAGNOSIS — H9191 Unspecified hearing loss, right ear: Secondary | ICD-10-CM | POA: Diagnosis not present

## 2019-02-26 DIAGNOSIS — Z0001 Encounter for general adult medical examination with abnormal findings: Secondary | ICD-10-CM | POA: Diagnosis not present

## 2019-02-26 DIAGNOSIS — E039 Hypothyroidism, unspecified: Secondary | ICD-10-CM | POA: Diagnosis not present

## 2019-02-26 DIAGNOSIS — E538 Deficiency of other specified B group vitamins: Secondary | ICD-10-CM | POA: Diagnosis not present

## 2019-02-26 DIAGNOSIS — Z23 Encounter for immunization: Secondary | ICD-10-CM | POA: Diagnosis not present

## 2019-03-02 DIAGNOSIS — M545 Low back pain: Secondary | ICD-10-CM | POA: Diagnosis not present

## 2019-03-04 DIAGNOSIS — M545 Low back pain: Secondary | ICD-10-CM | POA: Diagnosis not present

## 2019-03-08 DIAGNOSIS — N950 Postmenopausal bleeding: Secondary | ICD-10-CM | POA: Diagnosis not present

## 2019-03-09 DIAGNOSIS — M545 Low back pain: Secondary | ICD-10-CM | POA: Diagnosis not present

## 2019-03-11 DIAGNOSIS — F411 Generalized anxiety disorder: Secondary | ICD-10-CM | POA: Diagnosis not present

## 2019-03-12 DIAGNOSIS — M545 Low back pain: Secondary | ICD-10-CM | POA: Diagnosis not present

## 2019-03-16 DIAGNOSIS — M545 Low back pain: Secondary | ICD-10-CM | POA: Diagnosis not present

## 2019-03-19 DIAGNOSIS — M545 Low back pain: Secondary | ICD-10-CM | POA: Diagnosis not present

## 2019-03-22 ENCOUNTER — Encounter: Payer: Self-pay | Admitting: Podiatry

## 2019-03-22 ENCOUNTER — Other Ambulatory Visit: Payer: Self-pay

## 2019-03-22 ENCOUNTER — Ambulatory Visit (INDEPENDENT_AMBULATORY_CARE_PROVIDER_SITE_OTHER): Payer: Medicare Other

## 2019-03-22 ENCOUNTER — Ambulatory Visit (INDEPENDENT_AMBULATORY_CARE_PROVIDER_SITE_OTHER): Payer: Medicare Other | Admitting: Podiatry

## 2019-03-22 DIAGNOSIS — S92911A Unspecified fracture of right toe(s), initial encounter for closed fracture: Secondary | ICD-10-CM

## 2019-03-22 DIAGNOSIS — M79671 Pain in right foot: Secondary | ICD-10-CM

## 2019-03-22 NOTE — Progress Notes (Signed)
Subjective:  Patient ID: Lindsay Munoz, female    DOB: September 04, 1950,  MRN: [id number]  Chief Complaint  Patient presents with  . Foot Pain    bil foot pain, pain is located on the right foot toes 1-3, pt states that she dropped a 45 lbs weight on her toe yesterday at the gym    68 y.o. female presents with the above complaint.  Patient states that the injury happened yesterday morning.  She denies any other acute complaints.  She does not have any paresthesia to the both toes.  She is has motor and sensory function is intact to her right foot.   Review of Systems: Negative except as noted in the HPI. Denies N/V/F/Ch.  Past Medical History:  Diagnosis Date  . Celiac disease   . Thyroid disease     Current Outpatient Medications:  .  ALPRAZolam (XANAX) 0.25 MG tablet, , Disp: , Rfl:  .  ciclopirox (PENLAC) 8 % solution, , Disp: , Rfl:  .  diclofenac (VOLTAREN) 75 MG EC tablet, Take 1 tablet (75 mg total) by mouth 2 (two) times daily., Disp: 50 tablet, Rfl: 2 .  diclofenac sodium (VOLTAREN) 1 % GEL, Apply 2 g topically 4 (four) times daily. Rub into affected area of foot 2 to 4 times daily, Disp: 100 g, Rfl: 2 .  HYDROcodone-acetaminophen (NORCO/VICODIN) 5-325 MG tablet, hydrocodone 5 mg-acetaminophen 325 mg tablet, Disp: , Rfl:  .  levothyroxine (SYNTHROID) 88 MCG tablet, Synthroid 88 mcg tablet, Disp: , Rfl:  .  MINIVELLE 0.0375 MG/24HR, , Disp: , Rfl:  .  progesterone (PROMETRIUM) 100 MG capsule, progesterone micronized 100 mg capsule, Disp: , Rfl:  .  progesterone (PROMETRIUM) 200 MG capsule, Take 200 mg by mouth at bedtime., Disp: , Rfl:  .  SUMAtriptan (IMITREX) 50 MG tablet, , Disp: , Rfl:  .  valACYclovir (VALTREX) 500 MG tablet, valacyclovir 500 mg tablet, Disp: , Rfl:   Social History   Tobacco Use  Smoking Status Never Smoker  Smokeless Tobacco Never Used    Allergies  Allergen Reactions  . Gluten Meal Other (See Comments)  . Penicillins    Objective:  There were  no vitals filed for this visit. There is no height or weight on file to calculate BMI. Constitutional Well developed. Well nourished.  Vascular Dorsalis pedis pulses palpable bilaterally. Posterior tibial pulses palpable bilaterally. Capillary refill normal to all digits.  No cyanosis or clubbing noted. Pedal hair growth normal.  Neurologic Normal speech. Oriented to person, place, and time. Epicritic sensation to light touch grossly present bilaterally.  Dermatologic Nails well groomed and normal in appearance. No open wounds. No skin lesions.  Orthopedic:  Pain on palpation to the right first second and third digit.  There is ecchymosis present on dorsal and plantar aspect of first second and third digit.  Her motor and sensory functions are intact.  Her extensor and flexors are firing as normal.   Radiographs: 3 views of skeletally mature adult right AP lateral medial oblique. There is a distal fracture of the distal phalanx of the great toe.  There is a transverse through and through fracture of the midshaft of the proximal bones of the second digit.  There is mild small break in the third midshaft of the third proximal phalanx.  The capital fragments are well aligned without any deviations.  No shortening or angulation noted at this time.  Good alignment noted. Assessment:   1. Closed fracture of phalanx of toe of  right foot, physeal involvement unspecified, unspecified toe, initial encounter    Plan:  Patient was evaluated and treated and all questions answered.  Multiple closed fracture of the phalanx of the toe of the right foot -I explained to the patient the importance of maintaining partial weightbearing to the heel with a cam boot.  Patient understands the risk and complication including malunion malalignment nonunion. -I will see the patient back again in 2 weeks. -I gave patient a prescription to be out of work for the next 2 weeks.  No follow-ups on file.

## 2019-03-23 ENCOUNTER — Telehealth: Payer: Self-pay | Admitting: Podiatry

## 2019-03-23 DIAGNOSIS — M545 Low back pain: Secondary | ICD-10-CM | POA: Diagnosis not present

## 2019-03-23 NOTE — Telephone Encounter (Signed)
Pt was seen in office yesterday for a fracture and was put in a boot until her f/u appt on 10/26. At checkout the patient was given a note writing her out of work until that appt. Pt is wanting to have the note updated because she is starting a new job this week. Please give patient a call.

## 2019-03-24 NOTE — Telephone Encounter (Signed)
I called pt for clarification in her rrequest for a updated note. Pt states she is to start a new job, which requires a lot of walking and standing and they will not let her begin with the requirements of the note. Pt states she broke three toes. I told pt then she needs to be out of work as recommended by Dr. Posey Pronto.

## 2019-03-30 DIAGNOSIS — M20012 Mallet finger of left finger(s): Secondary | ICD-10-CM | POA: Diagnosis not present

## 2019-04-02 ENCOUNTER — Encounter: Payer: Self-pay | Admitting: Podiatry

## 2019-04-02 ENCOUNTER — Ambulatory Visit (INDEPENDENT_AMBULATORY_CARE_PROVIDER_SITE_OTHER): Payer: Medicare Other

## 2019-04-02 ENCOUNTER — Ambulatory Visit (INDEPENDENT_AMBULATORY_CARE_PROVIDER_SITE_OTHER): Payer: Medicare Other | Admitting: Podiatry

## 2019-04-02 ENCOUNTER — Other Ambulatory Visit: Payer: Self-pay

## 2019-04-02 DIAGNOSIS — S92911D Unspecified fracture of right toe(s), subsequent encounter for fracture with routine healing: Secondary | ICD-10-CM

## 2019-04-02 NOTE — Progress Notes (Signed)
Subjective: 68 year old female presents the office for follow-up evaluation fractures to the toes on her right foot.  She states that she is wearing the boot patient elevating her foot as well as using Ace bandage.  Somewhat better swelling bruising and swelling.  This happened while she was trying to work out at [employer] and she dropped her weight on her foot. Denies any systemic complaints such as fevers, chills, nausea, vomiting. No acute changes since last appointment, and no other complaints at this time.   Objective: AAO x3, NAD DP/PT pulses palpable bilaterally, CRT less than 3 seconds Persaud bruising, swelling to the right first and second, third toes of the toes are in rectus position.  Mild tenderness palpation to the toes.  Mild edema.  No open sores identified. No pain with calf compression, swelling, warmth, erythema  Assessment: Right foot digital fracture  Plan: -All treatment options discussed with the patient including all alternatives, risks, complications.  -Repeat x-rays were obtained reviewed.  Fractures present the distal phalanx of the hallux, phalanx of the second third digits.  Mild displacement of the second. -On her stay immobilized.  She had a surgical shoe at home that she can wear.  Encouraged ice elevation. -Plan: Discussion regards to length of duration for fracture healing. -Patient encouraged to call the office with any questions, concerns, change in symptoms.   Return in about 2 weeks (around 04/16/2019). At her request. X-ray next appointment   Trula Slade DPM

## 2019-04-05 ENCOUNTER — Ambulatory Visit: Payer: Medicare Other | Admitting: Podiatry

## 2019-04-08 ENCOUNTER — Telehealth: Payer: Self-pay | Admitting: Podiatry

## 2019-04-08 DIAGNOSIS — M545 Low back pain: Secondary | ICD-10-CM | POA: Diagnosis not present

## 2019-04-08 NOTE — Telephone Encounter (Signed)
Pt is currently in a boot due to a fracture in her foot and would like to know what her restrictions are for weightbearing and if she is allowed to walk without the boot. Please give patient a call.

## 2019-04-08 NOTE — Telephone Encounter (Signed)
I called pt and she states she is now in a walking shoe, but still has some swelling and discomfort. I told pt that she needs to wear the surgical walking shoe/sandal until she has pain or swelling then go into the walking boot and to wear the boot outside of the house. Pt states she is still wearing an ace wrap. I told pt she may benefit from a compression sock to be put on 1st thing in the morning, that would give even compression to decrease swelling and also not have so many pressure points and so much material at the foot area as the ace. Pt states she showers 1st thing in the morning, I told pt not to let the hot water hit the foot with the fracture. Pt asked if she should wear something to go to the bathroom and I said yes. Pt states she is getting discouraged it has been two weeks. I told pt that often it take 4-6 weeks to see a healing bone callous formation. Pt states understanding. Pt thanked me for my call.

## 2019-04-12 ENCOUNTER — Telehealth: Payer: Self-pay | Admitting: Podiatry

## 2019-04-12 NOTE — Telephone Encounter (Signed)
Left message informing pt not to perform pushing type exercises with a fracture and she could wear a sock.

## 2019-04-12 NOTE — Telephone Encounter (Signed)
Pt called wanting to know if she is able to use a exercise bike at this time and would also like to know if she can wear a sock on her foot as it is starting to get colder outside. Please give patient a call.

## 2019-04-13 DIAGNOSIS — M20012 Mallet finger of left finger(s): Secondary | ICD-10-CM | POA: Diagnosis not present

## 2019-04-13 DIAGNOSIS — M545 Low back pain: Secondary | ICD-10-CM | POA: Diagnosis not present

## 2019-04-16 DIAGNOSIS — M545 Low back pain: Secondary | ICD-10-CM | POA: Diagnosis not present

## 2019-04-19 ENCOUNTER — Encounter: Payer: Self-pay | Admitting: Podiatry

## 2019-04-19 ENCOUNTER — Ambulatory Visit (INDEPENDENT_AMBULATORY_CARE_PROVIDER_SITE_OTHER): Payer: Medicare Other

## 2019-04-19 ENCOUNTER — Ambulatory Visit (INDEPENDENT_AMBULATORY_CARE_PROVIDER_SITE_OTHER): Payer: Medicare Other | Admitting: Podiatry

## 2019-04-19 ENCOUNTER — Other Ambulatory Visit: Payer: Self-pay

## 2019-04-19 DIAGNOSIS — S92911D Unspecified fracture of right toe(s), subsequent encounter for fracture with routine healing: Secondary | ICD-10-CM | POA: Diagnosis not present

## 2019-04-19 DIAGNOSIS — M79671 Pain in right foot: Secondary | ICD-10-CM | POA: Diagnosis not present

## 2019-04-20 DIAGNOSIS — M545 Low back pain: Secondary | ICD-10-CM | POA: Diagnosis not present

## 2019-04-20 NOTE — Progress Notes (Signed)
Subjective: 68 year old female presents the office for follow-up evaluation fractures to the toes on her right foot.  She presents today for further evaluation.  She states that she still getting discomfort but overall she does report that she has had some improvement.  She is very upset about the injury and anxious about the healing.  She wants an x-ray today to see how she is doing. Denies any systemic complaints such as fevers, chills, nausea, vomiting. No acute changes since last appointment, and no other complaints at this time.   Objective: AAO x3, NAD DP/PT pulses palpable bilaterally, CRT less than 3 seconds Mild bruising present to the digits still but overall improved.  There is no swelling.  The toes are in rectus position.  Mild discomfort of the digits but overall pain appears to be improved upon palpation.  There is no open lesions. No pain with calf compression, swelling, warmth, erythema  Assessment: Right foot digital fracture  Plan: -All treatment options discussed with the patient including all alternatives, risks, complications.  -Repeat x-rays were obtained reviewed.  Fractures present the distal phalanx of the hallux, phalanx of the second third digits.  Mild displacement of the second.  There is some healing across the fracture site but radiolucent line still evident. -Unsteady on her remain in surgical shoe.  Continue ice elevation limit activity.  She had several questions today which were answered.  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  Note was provided.  Return in about 2 weeks (around 05/03/2019).  Trula Slade DPM

## 2019-05-03 DIAGNOSIS — M545 Low back pain: Secondary | ICD-10-CM | POA: Diagnosis not present

## 2019-05-04 ENCOUNTER — Other Ambulatory Visit: Payer: Self-pay

## 2019-05-04 ENCOUNTER — Ambulatory Visit (INDEPENDENT_AMBULATORY_CARE_PROVIDER_SITE_OTHER): Payer: Medicare Other | Admitting: Podiatry

## 2019-05-04 ENCOUNTER — Encounter: Payer: Self-pay | Admitting: Podiatry

## 2019-05-04 ENCOUNTER — Ambulatory Visit (INDEPENDENT_AMBULATORY_CARE_PROVIDER_SITE_OTHER): Payer: Medicare Other

## 2019-05-04 DIAGNOSIS — E039 Hypothyroidism, unspecified: Secondary | ICD-10-CM | POA: Diagnosis not present

## 2019-05-04 DIAGNOSIS — S92911D Unspecified fracture of right toe(s), subsequent encounter for fracture with routine healing: Secondary | ICD-10-CM

## 2019-05-04 DIAGNOSIS — Z03818 Encounter for observation for suspected exposure to other biological agents ruled out: Secondary | ICD-10-CM | POA: Diagnosis not present

## 2019-05-04 NOTE — Patient Instructions (Signed)
Toe Fracture A toe fracture is a break in one of the toe bones (phalanges). This may happen if you:  Drop a heavy object on your toe.  Stub your toe.  Twist your toe.  Exercise the same way too much. What are the signs or symptoms? The main symptoms are swelling and pain in the toe. You may also have:  Bruising.  Stiffness.  Numbness.  A change in the way the toe looks.  Broken bones that poke through the skin.  Blood under the toenail. How is this treated? Treatments may include:  Taping the broken toe to a toe that is next to it (buddy taping).  Wearing a shoe that has a wide, rigid sole to protect the toe and to limit its movement.  Wearing a cast.  Surgery. This may be needed if the: ? Pieces of broken bone are out of place. ? Bone pokes through the skin.  Physical therapy. Follow these instructions at home: If you have a shoe:  Wear the shoe as told by your doctor. Remove it only as told by your doctor.  Loosen the shoe if your toes tingle, become numb, or turn cold and blue.  Keep the shoe clean and dry. If you have a cast:  Do not put pressure on any part of the cast until it is fully hardened. This may take a few hours.  Do not stick anything inside the cast to scratch your skin.  Check the skin around the cast every day. Tell your doctor about any concerns.  You may put lotion on dry skin around the edges of the cast.  Do not put lotion on the skin under the cast.  Keep the cast clean and dry. Bathing  Do not take baths, swim, or use a hot tub until your doctor says it is okay. Ask your doctor if you can take showers.  If the shoe or cast is not waterproof: ? Do not let it get wet. ? Cover it with a watertight covering when you take a bath or a shower. Activity  Do not use your foot to support your body weight until your doctor says it is okay.  Use crutches as told by your doctor.  Ask your doctor what activities are safe for you  during recovery.  Avoid activities as told by your doctor.  Do exercises as told by your doctor or therapist. Driving  Do not drive or use heavy machinery while taking pain medicine.  Do not drive while wearing a cast on a foot that you use for driving. Managing pain, stiffness, and swelling   Put ice on the injured area if told by your doctor: ? Put ice in a plastic bag. ? Place a towel between your skin and the bag.  If you have a shoe, remove it as told by your doctor.  If you have a cast, place a towel between your cast and the bag. ? Leave the ice on for 20 minutes, 2-3 times per day.  Raise (elevate) the injured area above the level of your heart while you are sitting or lying down. General instructions  If your toe was taped to a toe that is next to it, follow your doctor's instructions for changing the gauze and tape. Change it more often: ? If the gauze and tape get wet. If this happens, dry the space between the toes. ? If the gauze and tape are too tight and they cause your toe to become pale   or to lose feeling (go numb).  If your doctor did not give you a protective shoe, wear sturdy shoes that support your foot. Your shoes should not: ? Pinch your toes. ? Fit tightly against your toes.  Do not use any tobacco products, including cigarettes, chewing tobacco, or e-cigarettes. These can delay bone healing. If you need help quitting, ask your doctor.  Take medicines only as told by your doctor.  Keep all follow-up visits as told by your doctor. This is important. Contact a doctor if:  Your pain medicine is not helping.  You have a fever.  You notice a bad smell coming from your cast. Get help right away if:  You lose feeling (have numbness) in your toe or foot, and it is getting worse.  Your toe or your foot tingles.  Your toe or your foot gets cold or turns blue.  You have redness or swelling in your toe or foot, and it is getting worse.  You have very  bad pain. Summary  A toe fracture is a break in one of the toe bones.  Use ice and raise your foot. This will help lessen pain and swelling.  Use crutches as told by your doctor. This information is not intended to replace advice given to you by your health care provider. Make sure you discuss any questions you have with your health care provider. Document Released: 11/13/2007 Document Revised: 07/31/2017 Document Reviewed: 07/08/2017 Elsevier Patient Education  2020 [employer].

## 2019-05-11 DIAGNOSIS — F411 Generalized anxiety disorder: Secondary | ICD-10-CM | POA: Diagnosis not present

## 2019-05-12 DIAGNOSIS — M545 Low back pain: Secondary | ICD-10-CM | POA: Diagnosis not present

## 2019-05-12 NOTE — Progress Notes (Signed)
Subjective: 68 year old female presents the office in follow-up evaluation of her right toe fractures.  She states that it has started to feel much better.  She does still get some swelling and tenderness mostly at nighttime.  She describes her pain level 2/10.  She is still wearing a surgical shoe as well as icing and using the Ace bandage.  She is eager to get back to activities.  Denies any systemic complaints such as fevers, chills, nausea, vomiting. No acute changes since last appointment, and no other complaints at this time.   Objective: AAO x3, NAD DP/PT pulses palpable bilaterally, CRT less than 3 seconds There is no significant tenderness palpation of the toes.  The toes are in rectus position.  Mild swelling.  Is no erythema or warmth.  No open lesions.  No pain the metatarsals. No open lesions or pre-ulcerative lesions.  No pain with calf compression, swelling, warmth, erythema  Assessment: Right foot digital fractures  Plan: -All treatment options discussed with the patient including all alternatives, risks, complications.  -Repeat x-rays obtained reviewed there is increased consolidation across the fracture site but radiolucent lines are still evident.  Minimal displacement. -For now I want her to remain in surgical shoe given her continued pain and swelling.  She was asking me about restrictions.  I want her to hold off on exercising this will involve her lower extremities to a pressure of the toes.  As she starts to feel better though she can transition to regular, stiff bottom shoe. -Patient encouraged to call the office with any questions, concerns, change in symptoms.   Trula Slade DPM

## 2019-05-13 DIAGNOSIS — E039 Hypothyroidism, unspecified: Secondary | ICD-10-CM | POA: Diagnosis not present

## 2019-05-18 ENCOUNTER — Ambulatory Visit (INDEPENDENT_AMBULATORY_CARE_PROVIDER_SITE_OTHER): Payer: Medicare Other

## 2019-05-18 ENCOUNTER — Other Ambulatory Visit: Payer: Self-pay

## 2019-05-18 ENCOUNTER — Ambulatory Visit (INDEPENDENT_AMBULATORY_CARE_PROVIDER_SITE_OTHER): Payer: Medicare Other | Admitting: Podiatry

## 2019-05-18 ENCOUNTER — Encounter: Payer: Self-pay | Admitting: Podiatry

## 2019-05-18 DIAGNOSIS — S92911D Unspecified fracture of right toe(s), subsequent encounter for fracture with routine healing: Secondary | ICD-10-CM

## 2019-05-18 DIAGNOSIS — M79671 Pain in right foot: Secondary | ICD-10-CM | POA: Diagnosis not present

## 2019-05-18 DIAGNOSIS — M20012 Mallet finger of left finger(s): Secondary | ICD-10-CM | POA: Diagnosis not present

## 2019-05-19 ENCOUNTER — Telehealth: Payer: Self-pay | Admitting: Podiatry

## 2019-05-19 NOTE — Telephone Encounter (Signed)
Pt called stating she had not heard from the physical therapy or our office to schedule an appt for PT. I told pt it takes a few days and that if she did not hear in a few days to call back.

## 2019-05-19 NOTE — Telephone Encounter (Signed)
Yes, we put the referral in yesterday. Lattie Haw, can you follow up on it? Thanks.

## 2019-05-20 NOTE — Telephone Encounter (Signed)
Hey Dr Jacqualyn Posey, sent this referral to the [facility] this morning. Lattie Haw

## 2019-05-21 ENCOUNTER — Telehealth: Payer: Self-pay | Admitting: Podiatry

## 2019-05-21 DIAGNOSIS — M79671 Pain in right foot: Secondary | ICD-10-CM

## 2019-05-21 DIAGNOSIS — S92911D Unspecified fracture of right toe(s), subsequent encounter for fracture with routine healing: Secondary | ICD-10-CM

## 2019-05-21 NOTE — Telephone Encounter (Signed)
Faxed required form, demographics to [facility] per pt request.

## 2019-05-21 NOTE — Telephone Encounter (Signed)
Pt is requesting a new physical therapy referral to [facility] because she has been there before.  States she does not want to go to [facility].  Patient is requesting a script be faxed there so she can schedule appointment.   Please contact patient when referral is sent.

## 2019-05-21 NOTE — Addendum Note (Signed)
Addended by: Harriett Sine D on: 05/21/2019 08:59 AM   Modules accepted: Orders

## 2019-05-24 NOTE — Progress Notes (Signed)
Subjective: 68 year old female presents the office in follow-up evaluation of her right toe fractures.  She states that overall she is feeling better but she wants to see how much better she is doing.  She is eager to get back to the gym and back to normal activities.  She denies any significant discomfort at this time.  She does give an Ace bandage on the foot to help and she is been wearing the surgical shoe.  No new injuries or falls.  She has no other concerns today. Denies any systemic complaints such as fevers, chills, nausea, vomiting. No acute changes since last appointment, and no other complaints at this time.   Objective: AAO x3, NAD DP/PT pulses palpable bilaterally, CRT less than 3 seconds There is no significant tenderness palpation of the toes.  The toes are in rectus position.  Minimal swelling but there is no erythema or warmth.  There is no open lesions identified.  No pain with calf compression, swelling, warmth, erythema  Assessment: Right foot digital fractures- improving  Plan: -All treatment options discussed with the patient including all alternatives, risks, complications.  -Repeat x-rays obtained reviewed there is increased consolidation across the fracture site but radiolucent lines are still evident most notably on the second toe as well as the distal hallux.  Minimal displacement. -Overall she is doing better.  She can start to transition back into regular shoe as she as able.  A graphite insert was dispensed.  Discussed gradual increase activity level.  Discussed different exercises that she can do. -Encouraged ice elevation. -Patient encouraged to call the office with any questions, concerns, change in symptoms.   Return in about 3 weeks (around 06/08/2019).  Repeat x-rays  Trula Slade DPM

## 2019-05-25 DIAGNOSIS — M25474 Effusion, right foot: Secondary | ICD-10-CM | POA: Diagnosis not present

## 2019-05-25 DIAGNOSIS — M62571 Muscle wasting and atrophy, not elsewhere classified, right ankle and foot: Secondary | ICD-10-CM | POA: Diagnosis not present

## 2019-05-25 DIAGNOSIS — M62561 Muscle wasting and atrophy, not elsewhere classified, right lower leg: Secondary | ICD-10-CM | POA: Diagnosis not present

## 2019-05-25 DIAGNOSIS — R269 Unspecified abnormalities of gait and mobility: Secondary | ICD-10-CM | POA: Diagnosis not present

## 2019-05-25 DIAGNOSIS — M25471 Effusion, right ankle: Secondary | ICD-10-CM | POA: Diagnosis not present

## 2019-05-25 DIAGNOSIS — M25674 Stiffness of right foot, not elsewhere classified: Secondary | ICD-10-CM | POA: Diagnosis not present

## 2019-05-25 DIAGNOSIS — M25571 Pain in right ankle and joints of right foot: Secondary | ICD-10-CM | POA: Diagnosis not present

## 2019-05-27 DIAGNOSIS — M62571 Muscle wasting and atrophy, not elsewhere classified, right ankle and foot: Secondary | ICD-10-CM | POA: Diagnosis not present

## 2019-05-27 DIAGNOSIS — M25474 Effusion, right foot: Secondary | ICD-10-CM | POA: Diagnosis not present

## 2019-05-27 DIAGNOSIS — M25471 Effusion, right ankle: Secondary | ICD-10-CM | POA: Diagnosis not present

## 2019-05-27 DIAGNOSIS — M25674 Stiffness of right foot, not elsewhere classified: Secondary | ICD-10-CM | POA: Diagnosis not present

## 2019-05-27 DIAGNOSIS — M25571 Pain in right ankle and joints of right foot: Secondary | ICD-10-CM | POA: Diagnosis not present

## 2019-05-27 DIAGNOSIS — M62561 Muscle wasting and atrophy, not elsewhere classified, right lower leg: Secondary | ICD-10-CM | POA: Diagnosis not present

## 2019-05-27 DIAGNOSIS — R269 Unspecified abnormalities of gait and mobility: Secondary | ICD-10-CM | POA: Diagnosis not present

## 2019-05-28 DIAGNOSIS — M25642 Stiffness of left hand, not elsewhere classified: Secondary | ICD-10-CM | POA: Diagnosis not present

## 2019-05-30 DIAGNOSIS — R269 Unspecified abnormalities of gait and mobility: Secondary | ICD-10-CM | POA: Diagnosis not present

## 2019-05-30 DIAGNOSIS — M25471 Effusion, right ankle: Secondary | ICD-10-CM | POA: Diagnosis not present

## 2019-05-30 DIAGNOSIS — M25674 Stiffness of right foot, not elsewhere classified: Secondary | ICD-10-CM | POA: Diagnosis not present

## 2019-05-30 DIAGNOSIS — M25474 Effusion, right foot: Secondary | ICD-10-CM | POA: Diagnosis not present

## 2019-05-30 DIAGNOSIS — M62571 Muscle wasting and atrophy, not elsewhere classified, right ankle and foot: Secondary | ICD-10-CM | POA: Diagnosis not present

## 2019-05-30 DIAGNOSIS — M62561 Muscle wasting and atrophy, not elsewhere classified, right lower leg: Secondary | ICD-10-CM | POA: Diagnosis not present

## 2019-05-30 DIAGNOSIS — M25571 Pain in right ankle and joints of right foot: Secondary | ICD-10-CM | POA: Diagnosis not present

## 2019-05-31 DIAGNOSIS — N950 Postmenopausal bleeding: Secondary | ICD-10-CM | POA: Diagnosis not present

## 2019-05-31 DIAGNOSIS — D259 Leiomyoma of uterus, unspecified: Secondary | ICD-10-CM | POA: Diagnosis not present

## 2019-06-01 ENCOUNTER — Other Ambulatory Visit: Payer: Medicare Other

## 2019-06-01 DIAGNOSIS — M62561 Muscle wasting and atrophy, not elsewhere classified, right lower leg: Secondary | ICD-10-CM | POA: Diagnosis not present

## 2019-06-01 DIAGNOSIS — M25674 Stiffness of right foot, not elsewhere classified: Secondary | ICD-10-CM | POA: Diagnosis not present

## 2019-06-01 DIAGNOSIS — M25471 Effusion, right ankle: Secondary | ICD-10-CM | POA: Diagnosis not present

## 2019-06-01 DIAGNOSIS — Z20828 Contact with and (suspected) exposure to other viral communicable diseases: Secondary | ICD-10-CM | POA: Diagnosis not present

## 2019-06-01 DIAGNOSIS — M25571 Pain in right ankle and joints of right foot: Secondary | ICD-10-CM | POA: Diagnosis not present

## 2019-06-01 DIAGNOSIS — M25474 Effusion, right foot: Secondary | ICD-10-CM | POA: Diagnosis not present

## 2019-06-01 DIAGNOSIS — R269 Unspecified abnormalities of gait and mobility: Secondary | ICD-10-CM | POA: Diagnosis not present

## 2019-06-01 DIAGNOSIS — M62571 Muscle wasting and atrophy, not elsewhere classified, right ankle and foot: Secondary | ICD-10-CM | POA: Diagnosis not present

## 2019-06-07 DIAGNOSIS — R269 Unspecified abnormalities of gait and mobility: Secondary | ICD-10-CM | POA: Diagnosis not present

## 2019-06-07 DIAGNOSIS — M25571 Pain in right ankle and joints of right foot: Secondary | ICD-10-CM | POA: Diagnosis not present

## 2019-06-07 DIAGNOSIS — M25674 Stiffness of right foot, not elsewhere classified: Secondary | ICD-10-CM | POA: Diagnosis not present

## 2019-06-07 DIAGNOSIS — M25471 Effusion, right ankle: Secondary | ICD-10-CM | POA: Diagnosis not present

## 2019-06-07 DIAGNOSIS — M25474 Effusion, right foot: Secondary | ICD-10-CM | POA: Diagnosis not present

## 2019-06-07 DIAGNOSIS — M62571 Muscle wasting and atrophy, not elsewhere classified, right ankle and foot: Secondary | ICD-10-CM | POA: Diagnosis not present

## 2019-06-07 DIAGNOSIS — M62561 Muscle wasting and atrophy, not elsewhere classified, right lower leg: Secondary | ICD-10-CM | POA: Diagnosis not present

## 2019-06-08 ENCOUNTER — Other Ambulatory Visit: Payer: Self-pay

## 2019-06-08 ENCOUNTER — Ambulatory Visit (INDEPENDENT_AMBULATORY_CARE_PROVIDER_SITE_OTHER): Payer: Medicare Other

## 2019-06-08 ENCOUNTER — Ambulatory Visit (INDEPENDENT_AMBULATORY_CARE_PROVIDER_SITE_OTHER): Payer: Medicare Other | Admitting: Podiatry

## 2019-06-08 DIAGNOSIS — M79671 Pain in right foot: Secondary | ICD-10-CM | POA: Diagnosis not present

## 2019-06-08 DIAGNOSIS — S92911D Unspecified fracture of right toe(s), subsequent encounter for fracture with routine healing: Secondary | ICD-10-CM | POA: Diagnosis not present

## 2019-06-09 DIAGNOSIS — M62561 Muscle wasting and atrophy, not elsewhere classified, right lower leg: Secondary | ICD-10-CM | POA: Diagnosis not present

## 2019-06-09 DIAGNOSIS — M25471 Effusion, right ankle: Secondary | ICD-10-CM | POA: Diagnosis not present

## 2019-06-09 DIAGNOSIS — R269 Unspecified abnormalities of gait and mobility: Secondary | ICD-10-CM | POA: Diagnosis not present

## 2019-06-09 DIAGNOSIS — M25571 Pain in right ankle and joints of right foot: Secondary | ICD-10-CM | POA: Diagnosis not present

## 2019-06-09 DIAGNOSIS — M62571 Muscle wasting and atrophy, not elsewhere classified, right ankle and foot: Secondary | ICD-10-CM | POA: Diagnosis not present

## 2019-06-09 DIAGNOSIS — M25642 Stiffness of left hand, not elsewhere classified: Secondary | ICD-10-CM | POA: Diagnosis not present

## 2019-06-09 DIAGNOSIS — M25674 Stiffness of right foot, not elsewhere classified: Secondary | ICD-10-CM | POA: Diagnosis not present

## 2019-06-09 DIAGNOSIS — M25474 Effusion, right foot: Secondary | ICD-10-CM | POA: Diagnosis not present

## 2019-06-10 NOTE — Progress Notes (Signed)
Subjective: 68 year old female presents the office in follow-up evaluation of her right toe fractures.  She says overall she is doing much better.  She is been doing physical therapy and this is been helpful to help regain strength and gait training.  She denies any significant pain at this time and she is back to wearing a regular shoe.   Denies any systemic complaints such as fevers, chills, nausea, vomiting. No acute changes since last appointment, and no other complaints at this time.   Objective: AAO x3, NAD DP/PT pulses palpable bilaterally, CRT less than 3 seconds There is no tenderness palpation of the toes.  The toes are in rectus position.  Minimal swelling but there is no erythema or warmth.  There is no open lesions identified.  No pain with calf compression, swelling, warmth, erythema  Assessment: Right foot digital fractures- improving  Plan: -All treatment options discussed with the patient including all alternatives, risks, complications.  -Repeat x-rays obtained reviewed there is increased consolidation across the fracture site but radiolucent lines are still evident most notably on the second toe as well as the distal hallux.  On the hallux it is starting to ossify.  Minimal displacement. -She is making progress and she is back to doing more normal activities.  Continue supportive shoe discussed continue with stiffer bottom shoe a graphite insert inside of her shoe.  Had a long discussion today in regards to exercises and activities that she can do.  She states that she is very happy that she.  Return in about 4 weeks (around 07/06/2019), or if symptoms worsen or fail to improve.  Trula Slade DPM

## 2019-06-14 ENCOUNTER — Telehealth: Payer: Self-pay | Admitting: Podiatry

## 2019-06-14 DIAGNOSIS — M25474 Effusion, right foot: Secondary | ICD-10-CM | POA: Diagnosis not present

## 2019-06-14 DIAGNOSIS — M25674 Stiffness of right foot, not elsewhere classified: Secondary | ICD-10-CM | POA: Diagnosis not present

## 2019-06-14 DIAGNOSIS — M25471 Effusion, right ankle: Secondary | ICD-10-CM | POA: Diagnosis not present

## 2019-06-14 DIAGNOSIS — M25571 Pain in right ankle and joints of right foot: Secondary | ICD-10-CM | POA: Diagnosis not present

## 2019-06-14 DIAGNOSIS — M62561 Muscle wasting and atrophy, not elsewhere classified, right lower leg: Secondary | ICD-10-CM | POA: Diagnosis not present

## 2019-06-14 DIAGNOSIS — R269 Unspecified abnormalities of gait and mobility: Secondary | ICD-10-CM | POA: Diagnosis not present

## 2019-06-14 DIAGNOSIS — M62571 Muscle wasting and atrophy, not elsewhere classified, right ankle and foot: Secondary | ICD-10-CM | POA: Diagnosis not present

## 2019-06-14 NOTE — Telephone Encounter (Signed)
Pt was instructed to use a firm insert in her shoes but states when she tries to wear tennis shoes it is uncomfortable/too stiff. Pt would like to know how long she is supposed to continue using the insert.

## 2019-06-14 NOTE — Telephone Encounter (Signed)
Pt states the inserts in her athletic shoes are too firm when walking on the treadmill at PT. I told pt that she should inform PT of this and they may offer an alternate therapy. Pt states she has firm [employer] and wears firm bottomed hiking boot other times. I told pt that she should ask PT if the [facility] were firm enough to wear on the treadmill if she was to continue on that modality or if alternate exercise from th treadmill. Pt states she will do that.

## 2019-06-16 DIAGNOSIS — M62561 Muscle wasting and atrophy, not elsewhere classified, right lower leg: Secondary | ICD-10-CM | POA: Diagnosis not present

## 2019-06-16 DIAGNOSIS — M25471 Effusion, right ankle: Secondary | ICD-10-CM | POA: Diagnosis not present

## 2019-06-16 DIAGNOSIS — F411 Generalized anxiety disorder: Secondary | ICD-10-CM | POA: Diagnosis not present

## 2019-06-16 DIAGNOSIS — M25474 Effusion, right foot: Secondary | ICD-10-CM | POA: Diagnosis not present

## 2019-06-16 DIAGNOSIS — M62571 Muscle wasting and atrophy, not elsewhere classified, right ankle and foot: Secondary | ICD-10-CM | POA: Diagnosis not present

## 2019-06-16 DIAGNOSIS — R269 Unspecified abnormalities of gait and mobility: Secondary | ICD-10-CM | POA: Diagnosis not present

## 2019-06-16 DIAGNOSIS — M25642 Stiffness of left hand, not elsewhere classified: Secondary | ICD-10-CM | POA: Diagnosis not present

## 2019-06-16 DIAGNOSIS — M25674 Stiffness of right foot, not elsewhere classified: Secondary | ICD-10-CM | POA: Diagnosis not present

## 2019-06-16 DIAGNOSIS — M25571 Pain in right ankle and joints of right foot: Secondary | ICD-10-CM | POA: Diagnosis not present

## 2019-06-17 DIAGNOSIS — M79642 Pain in left hand: Secondary | ICD-10-CM | POA: Diagnosis not present

## 2019-06-18 ENCOUNTER — Telehealth: Payer: Self-pay | Admitting: Podiatry

## 2019-06-18 NOTE — Telephone Encounter (Signed)
Yes that is fine

## 2019-06-18 NOTE — Telephone Encounter (Signed)
Pt called and is using the graphite insert you gave her and it is better but asking if she could get one for the other shoe to make it stiffer as well. She said if feels weird having one shoe stiff.She is wearing it in her snow boot.

## 2019-06-21 DIAGNOSIS — M62571 Muscle wasting and atrophy, not elsewhere classified, right ankle and foot: Secondary | ICD-10-CM | POA: Diagnosis not present

## 2019-06-21 DIAGNOSIS — M25674 Stiffness of right foot, not elsewhere classified: Secondary | ICD-10-CM | POA: Diagnosis not present

## 2019-06-21 DIAGNOSIS — M25571 Pain in right ankle and joints of right foot: Secondary | ICD-10-CM | POA: Diagnosis not present

## 2019-06-21 DIAGNOSIS — M62561 Muscle wasting and atrophy, not elsewhere classified, right lower leg: Secondary | ICD-10-CM | POA: Diagnosis not present

## 2019-06-21 DIAGNOSIS — R269 Unspecified abnormalities of gait and mobility: Secondary | ICD-10-CM | POA: Diagnosis not present

## 2019-06-21 DIAGNOSIS — M25474 Effusion, right foot: Secondary | ICD-10-CM | POA: Diagnosis not present

## 2019-06-21 DIAGNOSIS — M25471 Effusion, right ankle: Secondary | ICD-10-CM | POA: Diagnosis not present

## 2019-06-25 DIAGNOSIS — M25642 Stiffness of left hand, not elsewhere classified: Secondary | ICD-10-CM | POA: Diagnosis not present

## 2019-06-25 DIAGNOSIS — M25471 Effusion, right ankle: Secondary | ICD-10-CM | POA: Diagnosis not present

## 2019-06-25 DIAGNOSIS — R269 Unspecified abnormalities of gait and mobility: Secondary | ICD-10-CM | POA: Diagnosis not present

## 2019-06-25 DIAGNOSIS — M25474 Effusion, right foot: Secondary | ICD-10-CM | POA: Diagnosis not present

## 2019-06-25 DIAGNOSIS — M62561 Muscle wasting and atrophy, not elsewhere classified, right lower leg: Secondary | ICD-10-CM | POA: Diagnosis not present

## 2019-06-25 DIAGNOSIS — M25571 Pain in right ankle and joints of right foot: Secondary | ICD-10-CM | POA: Diagnosis not present

## 2019-06-25 DIAGNOSIS — M62571 Muscle wasting and atrophy, not elsewhere classified, right ankle and foot: Secondary | ICD-10-CM | POA: Diagnosis not present

## 2019-06-25 DIAGNOSIS — M25674 Stiffness of right foot, not elsewhere classified: Secondary | ICD-10-CM | POA: Diagnosis not present

## 2019-06-29 DIAGNOSIS — F411 Generalized anxiety disorder: Secondary | ICD-10-CM | POA: Diagnosis not present

## 2019-06-30 DIAGNOSIS — M6281 Muscle weakness (generalized): Secondary | ICD-10-CM | POA: Diagnosis not present

## 2019-06-30 DIAGNOSIS — M545 Low back pain: Secondary | ICD-10-CM | POA: Diagnosis not present

## 2019-07-02 DIAGNOSIS — M25642 Stiffness of left hand, not elsewhere classified: Secondary | ICD-10-CM | POA: Diagnosis not present

## 2019-07-05 DIAGNOSIS — M62571 Muscle wasting and atrophy, not elsewhere classified, right ankle and foot: Secondary | ICD-10-CM | POA: Diagnosis not present

## 2019-07-05 DIAGNOSIS — M62561 Muscle wasting and atrophy, not elsewhere classified, right lower leg: Secondary | ICD-10-CM | POA: Diagnosis not present

## 2019-07-05 DIAGNOSIS — M25571 Pain in right ankle and joints of right foot: Secondary | ICD-10-CM | POA: Diagnosis not present

## 2019-07-05 DIAGNOSIS — M25471 Effusion, right ankle: Secondary | ICD-10-CM | POA: Diagnosis not present

## 2019-07-05 DIAGNOSIS — M25674 Stiffness of right foot, not elsewhere classified: Secondary | ICD-10-CM | POA: Diagnosis not present

## 2019-07-05 DIAGNOSIS — R269 Unspecified abnormalities of gait and mobility: Secondary | ICD-10-CM | POA: Diagnosis not present

## 2019-07-05 DIAGNOSIS — M25474 Effusion, right foot: Secondary | ICD-10-CM | POA: Diagnosis not present

## 2019-07-06 ENCOUNTER — Ambulatory Visit (INDEPENDENT_AMBULATORY_CARE_PROVIDER_SITE_OTHER): Payer: Medicare Other | Admitting: Podiatry

## 2019-07-06 ENCOUNTER — Ambulatory Visit (INDEPENDENT_AMBULATORY_CARE_PROVIDER_SITE_OTHER): Payer: Medicare Other

## 2019-07-06 ENCOUNTER — Other Ambulatory Visit: Payer: Self-pay

## 2019-07-06 DIAGNOSIS — S92911D Unspecified fracture of right toe(s), subsequent encounter for fracture with routine healing: Secondary | ICD-10-CM | POA: Diagnosis not present

## 2019-07-06 DIAGNOSIS — L600 Ingrowing nail: Secondary | ICD-10-CM | POA: Diagnosis not present

## 2019-07-06 NOTE — Progress Notes (Signed)
Subjective: 69 year old female presents the office in follow-up evaluation of her right toe fractures.  She states that she is doing much better she is back to her normal activities.  She is asking if she can start to jump rope or running but she did not do this prior to the injury.  She also has a chronic ingrown toenail left big toe, medial aspect that she wants to talk about but she wants to have it done in the future but not today.  Denies any current pain or redness or drainage or any signs of infection. Denies any systemic complaints such as fevers, chills, nausea, vomiting. No acute changes since last appointment, and no other complaints at this time.   Objective: AAO x3, NAD DP/PT pulses palpable bilaterally, CRT less than 3 seconds There is no tenderness palpation of the toes.  The toes are in rectus position.  Trace edema and there is no erythema or warmth.  There is no open lesions identified.  Incurvation present medial aspect the left hallux toenail without any tenderness there is no edema, erythema and signs of infection. No pain with calf compression, swelling, warmth, erythema  Assessment: Right foot digital fractures- improving; chronic ingrown toenail  Plan: -All treatment options discussed with the patient including all alternatives, risks, complications.  -Repeat x-rays obtained reviewed there is increased consolidation across the fracture sites with mild displacement of the 2nd toe but unchanged. No new fracture is identified.  -Continue with normal activities.  Discussed that she would not jump roping or running before the injury I would not start now.  Otherwise she did ask her what she was doing prior to the toe fractures.  She has any issues with me now. -Discussed partial nail avulsion with chemical matricectomy of the left hallux.  She was able to obtain the same and consider doing this in the future.  Monitor for any signs or symptoms of infection.  Return if symptoms  worsen or fail to improve.  Trula Slade DPM

## 2019-07-08 DIAGNOSIS — K900 Celiac disease: Secondary | ICD-10-CM | POA: Diagnosis not present

## 2019-07-08 DIAGNOSIS — K580 Irritable bowel syndrome with diarrhea: Secondary | ICD-10-CM | POA: Diagnosis not present

## 2019-07-10 ENCOUNTER — Ambulatory Visit: Payer: Medicare Other

## 2019-07-14 DIAGNOSIS — F411 Generalized anxiety disorder: Secondary | ICD-10-CM | POA: Diagnosis not present

## 2019-07-15 ENCOUNTER — Ambulatory Visit: Payer: Medicare Other

## 2019-07-15 DIAGNOSIS — M6281 Muscle weakness (generalized): Secondary | ICD-10-CM | POA: Diagnosis not present

## 2019-07-15 DIAGNOSIS — M20012 Mallet finger of left finger(s): Secondary | ICD-10-CM | POA: Diagnosis not present

## 2019-07-15 DIAGNOSIS — M545 Low back pain: Secondary | ICD-10-CM | POA: Diagnosis not present

## 2019-07-18 ENCOUNTER — Ambulatory Visit: Payer: Medicare Other | Attending: Internal Medicine

## 2019-07-18 DIAGNOSIS — Z23 Encounter for immunization: Secondary | ICD-10-CM | POA: Insufficient documentation

## 2019-07-18 NOTE — Progress Notes (Signed)
   Covid-19 Vaccination Clinic  Name:  Lindsay Munoz    MRN: [id number] DOB: Nov 05, 1950  07/18/2019  [name] was observed post Covid-19 immunization for 15 minutes without incidence. She was provided with Vaccine Information Sheet and instruction to access the V-Safe system.   [name] was instructed to call 911 with any severe reactions post vaccine: Marland Kitchen Difficulty breathing  . Swelling of your face and throat  . A fast heartbeat  . A bad rash all over your body  . Dizziness and weakness    Immunizations Administered    Name Date Dose VIS Date Route   Pfizer COVID-19 Vaccine 07/18/2019  8:19 AM 0.3 mL 05/21/2019 Intramuscular   Manufacturer: [place]   Lot: [id number]   [place]: [id number]

## 2019-07-20 DIAGNOSIS — M20012 Mallet finger of left finger(s): Secondary | ICD-10-CM | POA: Diagnosis not present

## 2019-08-02 DIAGNOSIS — M20012 Mallet finger of left finger(s): Secondary | ICD-10-CM | POA: Diagnosis not present

## 2019-08-03 DIAGNOSIS — Z4789 Encounter for other orthopedic aftercare: Secondary | ICD-10-CM | POA: Diagnosis not present

## 2019-08-03 DIAGNOSIS — M25642 Stiffness of left hand, not elsewhere classified: Secondary | ICD-10-CM | POA: Diagnosis not present

## 2019-08-03 DIAGNOSIS — F411 Generalized anxiety disorder: Secondary | ICD-10-CM | POA: Diagnosis not present

## 2019-08-03 DIAGNOSIS — M20012 Mallet finger of left finger(s): Secondary | ICD-10-CM | POA: Diagnosis not present

## 2019-08-11 ENCOUNTER — Ambulatory Visit: Payer: Medicare Other | Attending: Internal Medicine

## 2019-08-11 DIAGNOSIS — Z23 Encounter for immunization: Secondary | ICD-10-CM | POA: Insufficient documentation

## 2019-08-11 NOTE — Progress Notes (Signed)
   Covid-19 Vaccination Clinic  Name:  Lindsay Munoz    MRN: [id number] DOB: [birth year]  08/11/2019  [name] was observed post Covid-19 immunization for 15 minutes without incident. She was provided with Vaccine Information Sheet and instruction to access the V-Safe system.   [name] was instructed to call 911 with any severe reactions post vaccine: Marland Kitchen Difficulty breathing  . Swelling of face and throat  . A fast heartbeat  . A bad rash all over body  . Dizziness and weakness   Immunizations Administered    Name Date Dose VIS Date Route   Pfizer COVID-19 Vaccine 08/11/2019  3:22 PM 0.3 mL 05/21/2019 Intramuscular   Manufacturer: [place]   Lot: [id number]   [place]: [id number]

## 2019-08-18 DIAGNOSIS — G43109 Migraine with aura, not intractable, without status migrainosus: Secondary | ICD-10-CM | POA: Diagnosis not present

## 2019-08-18 DIAGNOSIS — F419 Anxiety disorder, unspecified: Secondary | ICD-10-CM | POA: Diagnosis not present

## 2019-08-19 DIAGNOSIS — F411 Generalized anxiety disorder: Secondary | ICD-10-CM | POA: Diagnosis not present

## 2019-08-31 DIAGNOSIS — Z4789 Encounter for other orthopedic aftercare: Secondary | ICD-10-CM | POA: Diagnosis not present

## 2019-08-31 DIAGNOSIS — M20012 Mallet finger of left finger(s): Secondary | ICD-10-CM | POA: Diagnosis not present

## 2019-09-03 DIAGNOSIS — F411 Generalized anxiety disorder: Secondary | ICD-10-CM | POA: Diagnosis not present

## 2019-09-14 DIAGNOSIS — B009 Herpesviral infection, unspecified: Secondary | ICD-10-CM | POA: Diagnosis not present

## 2019-09-14 DIAGNOSIS — J301 Allergic rhinitis due to pollen: Secondary | ICD-10-CM | POA: Diagnosis not present

## 2019-09-14 DIAGNOSIS — H1132 Conjunctival hemorrhage, left eye: Secondary | ICD-10-CM | POA: Diagnosis not present

## 2019-09-21 DIAGNOSIS — F411 Generalized anxiety disorder: Secondary | ICD-10-CM | POA: Diagnosis not present

## 2019-09-21 DIAGNOSIS — M20012 Mallet finger of left finger(s): Secondary | ICD-10-CM | POA: Diagnosis not present

## 2019-09-22 DIAGNOSIS — Z6825 Body mass index (BMI) 25.0-25.9, adult: Secondary | ICD-10-CM | POA: Diagnosis not present

## 2019-09-22 DIAGNOSIS — M8588 Other specified disorders of bone density and structure, other site: Secondary | ICD-10-CM | POA: Diagnosis not present

## 2019-09-22 DIAGNOSIS — Z124 Encounter for screening for malignant neoplasm of cervix: Secondary | ICD-10-CM | POA: Diagnosis not present

## 2019-09-22 DIAGNOSIS — Z1231 Encounter for screening mammogram for malignant neoplasm of breast: Secondary | ICD-10-CM | POA: Diagnosis not present

## 2019-09-22 DIAGNOSIS — N958 Other specified menopausal and perimenopausal disorders: Secondary | ICD-10-CM | POA: Diagnosis not present

## 2019-10-12 DIAGNOSIS — F411 Generalized anxiety disorder: Secondary | ICD-10-CM | POA: Diagnosis not present

## 2019-10-15 DIAGNOSIS — M20012 Mallet finger of left finger(s): Secondary | ICD-10-CM | POA: Diagnosis not present

## 2019-10-15 DIAGNOSIS — Z4789 Encounter for other orthopedic aftercare: Secondary | ICD-10-CM | POA: Diagnosis not present

## 2019-10-20 DIAGNOSIS — F411 Generalized anxiety disorder: Secondary | ICD-10-CM | POA: Diagnosis not present

## 2019-11-04 DIAGNOSIS — K900 Celiac disease: Secondary | ICD-10-CM | POA: Diagnosis not present

## 2019-11-04 DIAGNOSIS — E039 Hypothyroidism, unspecified: Secondary | ICD-10-CM | POA: Diagnosis not present

## 2019-11-09 DIAGNOSIS — F411 Generalized anxiety disorder: Secondary | ICD-10-CM | POA: Diagnosis not present

## 2019-11-11 DIAGNOSIS — E039 Hypothyroidism, unspecified: Secondary | ICD-10-CM | POA: Diagnosis not present

## 2019-11-11 DIAGNOSIS — K900 Celiac disease: Secondary | ICD-10-CM | POA: Diagnosis not present

## 2019-12-02 DIAGNOSIS — F411 Generalized anxiety disorder: Secondary | ICD-10-CM | POA: Diagnosis not present

## 2019-12-02 DIAGNOSIS — M545 Low back pain: Secondary | ICD-10-CM | POA: Diagnosis not present

## 2019-12-09 DIAGNOSIS — M545 Low back pain: Secondary | ICD-10-CM | POA: Diagnosis not present

## 2019-12-16 DIAGNOSIS — M545 Low back pain: Secondary | ICD-10-CM | POA: Diagnosis not present

## 2019-12-17 DIAGNOSIS — K900 Celiac disease: Secondary | ICD-10-CM | POA: Diagnosis not present

## 2019-12-21 DIAGNOSIS — F411 Generalized anxiety disorder: Secondary | ICD-10-CM | POA: Diagnosis not present

## 2019-12-23 DIAGNOSIS — M545 Low back pain: Secondary | ICD-10-CM | POA: Diagnosis not present

## 2019-12-23 DIAGNOSIS — K580 Irritable bowel syndrome with diarrhea: Secondary | ICD-10-CM | POA: Diagnosis not present

## 2019-12-23 DIAGNOSIS — K900 Celiac disease: Secondary | ICD-10-CM | POA: Diagnosis not present

## 2019-12-28 DIAGNOSIS — K900 Celiac disease: Secondary | ICD-10-CM | POA: Diagnosis not present

## 2019-12-28 DIAGNOSIS — K580 Irritable bowel syndrome with diarrhea: Secondary | ICD-10-CM | POA: Diagnosis not present

## 2019-12-30 DIAGNOSIS — M545 Low back pain: Secondary | ICD-10-CM | POA: Diagnosis not present

## 2020-01-04 ENCOUNTER — Ambulatory Visit (INDEPENDENT_AMBULATORY_CARE_PROVIDER_SITE_OTHER): Payer: Medicare Other

## 2020-01-04 ENCOUNTER — Other Ambulatory Visit: Payer: Self-pay

## 2020-01-04 ENCOUNTER — Ambulatory Visit (INDEPENDENT_AMBULATORY_CARE_PROVIDER_SITE_OTHER): Payer: Medicare Other | Admitting: Podiatry

## 2020-01-04 DIAGNOSIS — L600 Ingrowing nail: Secondary | ICD-10-CM | POA: Diagnosis not present

## 2020-01-04 DIAGNOSIS — L603 Nail dystrophy: Secondary | ICD-10-CM

## 2020-01-04 DIAGNOSIS — M7989 Other specified soft tissue disorders: Secondary | ICD-10-CM

## 2020-01-04 DIAGNOSIS — T148XXA Other injury of unspecified body region, initial encounter: Secondary | ICD-10-CM | POA: Diagnosis not present

## 2020-01-04 DIAGNOSIS — M898X9 Other specified disorders of bone, unspecified site: Secondary | ICD-10-CM | POA: Diagnosis not present

## 2020-01-04 DIAGNOSIS — F411 Generalized anxiety disorder: Secondary | ICD-10-CM | POA: Diagnosis not present

## 2020-01-04 MED ORDER — DICLOFENAC SODIUM 1 % EX GEL: 2.0000 g | Freq: Four times a day (QID) | CUTANEOUS | 2 refills | Status: DC

## 2020-01-08 NOTE — Progress Notes (Signed)
Subjective: 69 year old female presents the office today with a main concern of a "bump" on the dorsal lateral aspect of the right midfoot.  She thinks this may have come from where she had a previous injury but she is not sure.  Area is not tender unless there is pressure on the area.  No recent injury.  Also her right big toenail she states has not been growing since the injury much.  Denies any pain.  Also ingrown, left hallux without significant drainage and she does not have the procedure performed today. Denies any systemic complaints such as fevers, chills, nausea, vomiting. No acute changes since last appointment, and no other complaints at this time.   Objective: AAO x3, NAD DP/PT pulses palpable bilaterally, CRT less than 3 seconds On the dorsal lateral aspect of midfoot along Lisfranc area is a small firm with mild mobility consistent with a small ganglion cyst with underlying bone spur.  There is no tenderness there is no edema, erythema.  Extensor tendons appear to be intact.  No other areas of pinpoint tenderness.  Mild incurvation present left hallux toenail without signs of infection or pain.  Right hallux has transverse ridging the distal aspect without any pain, redness or signs of infection. No pain with calf compression, swelling, warmth, erythema  Assessment: 69 year old female bone spur/small cyst right foot; onychodystrophy right hallux; left hallux ingrown toenail  Plan: -All treatment options discussed with the patient including all alternatives, risks, complications.  -X-ray-reviewed.  Fracture is noted to be healed.  No evidence of acute fracture identified today of calcifications. -Regards to the cyst on the right foot discussed daily injection but she wishes to hold off.  Discussed lacing techniques to avoid pressure when wearing shoes.  Otherwise she is having no other issues.  Will monitor. -Recommended a biotin supplement for the right toenail -Discussed nail trimming  techniques of left hallux.  Monitor for any signs or symptoms of infection if needed we can do partial nail avulsion. -Patient encouraged to call the office with any questions, concerns, change in symptoms.   Trula Slade DPM

## 2020-01-24 DIAGNOSIS — M545 Low back pain: Secondary | ICD-10-CM | POA: Diagnosis not present

## 2020-01-31 ENCOUNTER — Ambulatory Visit: Payer: Medicare Other | Admitting: Podiatry

## 2020-01-31 DIAGNOSIS — M545 Low back pain: Secondary | ICD-10-CM | POA: Diagnosis not present

## 2020-02-08 DIAGNOSIS — F411 Generalized anxiety disorder: Secondary | ICD-10-CM | POA: Diagnosis not present

## 2020-02-10 DIAGNOSIS — N951 Menopausal and female climacteric states: Secondary | ICD-10-CM | POA: Diagnosis not present

## 2020-02-10 DIAGNOSIS — Z780 Asymptomatic menopausal state: Secondary | ICD-10-CM | POA: Diagnosis not present

## 2020-02-17 ENCOUNTER — Other Ambulatory Visit: Payer: Self-pay

## 2020-02-17 ENCOUNTER — Ambulatory Visit (INDEPENDENT_AMBULATORY_CARE_PROVIDER_SITE_OTHER): Payer: Medicare Other | Admitting: Podiatry

## 2020-02-17 DIAGNOSIS — M779 Enthesopathy, unspecified: Secondary | ICD-10-CM

## 2020-02-21 NOTE — Progress Notes (Signed)
Subjective: 69 year old female presents the office today for concerns of recurrent pain to the right dorsal lateral midfoot.  She states that after her last appointment she was doing great and she was able to exercise and walking over 10,000 steps a day without any pain.  However she purchased a pair of very flat shoes to work out and so that way she could "feel the floor" better however after wearing the shoes the pain came back causing discomfort.  She has changed shoes and the pain is slowly getting better.  Denies any recent injury or trauma no swelling. Denies any systemic complaints such as fevers, chills, nausea, vomiting. No acute changes since last appointment, and no other complaints at this time.   Objective: AAO x3, NAD DP/PT pulses palpable bilaterally, CRT less than 3 seconds On the dorsal lateral aspect of midfoot along Lisfranc area became associated with some mild tenderness.  There is no specific area of pinpoint tenderness today.  No significant soft tissue mass is identified today as well.  Flexor, extensor tendons appear to be intact.  MMT 5/5.  No pain of the toes.  No pain in the ankle. No pain with calf compression, swelling, warmth, erythema  Assessment: 69 year old female right foot pain, capsulitis/tendinitis  Plan: -All treatment options discussed with the patient including all alternatives, risks, complications.  -I do think that her recurrence of pain is due to the fact that she is wearing very flat shoes without support working on it.  Discussed changing shoes and wear support.  If no improvement will do steroid injection but will hold off on this today.  Discussed Voltaren gel.  Trula Slade DPM

## 2020-02-22 DIAGNOSIS — F411 Generalized anxiety disorder: Secondary | ICD-10-CM | POA: Diagnosis not present

## 2020-02-24 DIAGNOSIS — M545 Low back pain: Secondary | ICD-10-CM | POA: Diagnosis not present

## 2020-02-29 DIAGNOSIS — M545 Low back pain: Secondary | ICD-10-CM | POA: Diagnosis not present

## 2020-03-07 ENCOUNTER — Ambulatory Visit: Payer: Medicare Other | Admitting: Podiatry

## 2020-03-09 DIAGNOSIS — F411 Generalized anxiety disorder: Secondary | ICD-10-CM | POA: Diagnosis not present

## 2020-03-10 DIAGNOSIS — E559 Vitamin D deficiency, unspecified: Secondary | ICD-10-CM | POA: Diagnosis not present

## 2020-03-10 DIAGNOSIS — Z0001 Encounter for general adult medical examination with abnormal findings: Secondary | ICD-10-CM | POA: Diagnosis not present

## 2020-03-10 DIAGNOSIS — E039 Hypothyroidism, unspecified: Secondary | ICD-10-CM | POA: Diagnosis not present

## 2020-03-10 DIAGNOSIS — Z23 Encounter for immunization: Secondary | ICD-10-CM | POA: Diagnosis not present

## 2020-03-10 DIAGNOSIS — M545 Low back pain, unspecified: Secondary | ICD-10-CM | POA: Diagnosis not present

## 2020-03-10 DIAGNOSIS — F419 Anxiety disorder, unspecified: Secondary | ICD-10-CM | POA: Diagnosis not present

## 2020-03-10 DIAGNOSIS — Z79899 Other long term (current) drug therapy: Secondary | ICD-10-CM | POA: Diagnosis not present

## 2020-03-10 DIAGNOSIS — Z136 Encounter for screening for cardiovascular disorders: Secondary | ICD-10-CM | POA: Diagnosis not present

## 2020-03-10 DIAGNOSIS — R7309 Other abnormal glucose: Secondary | ICD-10-CM | POA: Diagnosis not present

## 2020-03-10 DIAGNOSIS — J45909 Unspecified asthma, uncomplicated: Secondary | ICD-10-CM | POA: Diagnosis not present

## 2020-03-10 DIAGNOSIS — Z13220 Encounter for screening for lipoid disorders: Secondary | ICD-10-CM | POA: Diagnosis not present

## 2020-03-14 DIAGNOSIS — Z780 Asymptomatic menopausal state: Secondary | ICD-10-CM | POA: Diagnosis not present

## 2020-03-16 DIAGNOSIS — Z23 Encounter for immunization: Secondary | ICD-10-CM | POA: Diagnosis not present

## 2020-03-19 DIAGNOSIS — L218 Other seborrheic dermatitis: Secondary | ICD-10-CM | POA: Diagnosis not present

## 2020-03-19 DIAGNOSIS — L282 Other prurigo: Secondary | ICD-10-CM | POA: Diagnosis not present

## 2020-03-19 DIAGNOSIS — L659 Nonscarring hair loss, unspecified: Secondary | ICD-10-CM | POA: Diagnosis not present

## 2020-03-19 DIAGNOSIS — Z85828 Personal history of other malignant neoplasm of skin: Secondary | ICD-10-CM | POA: Diagnosis not present

## 2020-03-27 ENCOUNTER — Encounter: Payer: Self-pay | Admitting: Podiatry

## 2020-04-06 DIAGNOSIS — M5451 Vertebrogenic low back pain: Secondary | ICD-10-CM | POA: Diagnosis not present

## 2020-04-13 DIAGNOSIS — F411 Generalized anxiety disorder: Secondary | ICD-10-CM | POA: Diagnosis not present

## 2020-04-18 DIAGNOSIS — M545 Low back pain, unspecified: Secondary | ICD-10-CM | POA: Diagnosis not present

## 2020-04-27 DIAGNOSIS — F411 Generalized anxiety disorder: Secondary | ICD-10-CM | POA: Diagnosis not present

## 2020-04-28 DIAGNOSIS — M5451 Vertebrogenic low back pain: Secondary | ICD-10-CM | POA: Diagnosis not present

## 2020-05-03 DIAGNOSIS — M5451 Vertebrogenic low back pain: Secondary | ICD-10-CM | POA: Diagnosis not present

## 2020-05-08 DIAGNOSIS — M5451 Vertebrogenic low back pain: Secondary | ICD-10-CM | POA: Diagnosis not present

## 2020-05-08 DIAGNOSIS — E039 Hypothyroidism, unspecified: Secondary | ICD-10-CM | POA: Diagnosis not present

## 2020-05-12 DIAGNOSIS — E039 Hypothyroidism, unspecified: Secondary | ICD-10-CM | POA: Diagnosis not present

## 2020-05-12 DIAGNOSIS — K900 Celiac disease: Secondary | ICD-10-CM | POA: Diagnosis not present

## 2020-05-17 DIAGNOSIS — M5451 Vertebrogenic low back pain: Secondary | ICD-10-CM | POA: Diagnosis not present

## 2020-05-18 DIAGNOSIS — F411 Generalized anxiety disorder: Secondary | ICD-10-CM | POA: Diagnosis not present

## 2020-05-25 DIAGNOSIS — M5451 Vertebrogenic low back pain: Secondary | ICD-10-CM | POA: Diagnosis not present

## 2020-05-29 DIAGNOSIS — M5451 Vertebrogenic low back pain: Secondary | ICD-10-CM | POA: Diagnosis not present

## 2020-06-05 DIAGNOSIS — M5451 Vertebrogenic low back pain: Secondary | ICD-10-CM | POA: Diagnosis not present

## 2020-06-13 DIAGNOSIS — F330 Major depressive disorder, recurrent, mild: Secondary | ICD-10-CM | POA: Diagnosis not present

## 2020-06-13 DIAGNOSIS — F411 Generalized anxiety disorder: Secondary | ICD-10-CM | POA: Diagnosis not present

## 2020-06-13 DIAGNOSIS — F4321 Adjustment disorder with depressed mood: Secondary | ICD-10-CM | POA: Diagnosis not present

## 2020-06-19 DIAGNOSIS — M5451 Vertebrogenic low back pain: Secondary | ICD-10-CM | POA: Diagnosis not present

## 2020-06-20 ENCOUNTER — Other Ambulatory Visit: Payer: Self-pay

## 2020-06-20 ENCOUNTER — Ambulatory Visit (INDEPENDENT_AMBULATORY_CARE_PROVIDER_SITE_OTHER): Payer: PPO | Admitting: Podiatry

## 2020-06-20 DIAGNOSIS — L600 Ingrowing nail: Secondary | ICD-10-CM

## 2020-06-20 DIAGNOSIS — M779 Enthesopathy, unspecified: Secondary | ICD-10-CM | POA: Diagnosis not present

## 2020-06-20 DIAGNOSIS — M7751 Other enthesopathy of right foot: Secondary | ICD-10-CM

## 2020-06-25 NOTE — Progress Notes (Signed)
Subjective: 70 year old female presents the office today for evaluation of tendinitis on her right foot.  She states that she has purchased new shoes and she wanted to discuss shoe options today to help prevent any reoccurrence of foot pain.  She currently denies any discomfort no swelling.  She does like to work out at [employer] and also walk for exercise.  Both ingrown toenails been doing well and she denies any pain, redness or drainage or any signs of infection of the toenails. Denies any systemic complaints such as fevers, chills, nausea, vomiting. No acute changes since last appointment, and no other complaints at this time.   Objective: AAO x3, NAD DP/PT pulses palpable bilaterally, CRT less than 3 seconds At this time there is no area pinpoint tenderness identified bilaterally there is no edema, erythema.  Particular in the dorsal aspect the right foot there is no discomfort where she gets the demand of this.  There is no soft tissue mass identified at this time.  MMT 5/5.  The nails with mild incurvation to the hallux most of the left side but there is no pain, redness or drainage or any signs of infection. No pain with calf compression, swelling, warmth, erythema  Assessment: Tendinitis right foot, asymptomatic ingrown toenail left hallux  Plan: -All treatment options discussed with the patient including all alternatives, risks, complications.  -Patient brought in several shoes we discussed shoes that would be beneficial for her.  Discussed that she exercises daily.  Anti-inflammatories as needed. -Patient encouraged to call the office with any questions, concerns, change in symptoms.   Trula Slade DPM

## 2020-06-27 DIAGNOSIS — M5030 Other cervical disc degeneration, unspecified cervical region: Secondary | ICD-10-CM | POA: Diagnosis not present

## 2020-06-27 DIAGNOSIS — H6123 Impacted cerumen, bilateral: Secondary | ICD-10-CM | POA: Diagnosis not present

## 2020-06-28 DIAGNOSIS — M5459 Other low back pain: Secondary | ICD-10-CM | POA: Diagnosis not present

## 2020-07-04 DIAGNOSIS — F411 Generalized anxiety disorder: Secondary | ICD-10-CM | POA: Diagnosis not present

## 2020-07-04 DIAGNOSIS — F4321 Adjustment disorder with depressed mood: Secondary | ICD-10-CM | POA: Diagnosis not present

## 2020-07-04 DIAGNOSIS — Z111 Encounter for screening for respiratory tuberculosis: Secondary | ICD-10-CM | POA: Diagnosis not present

## 2020-07-04 DIAGNOSIS — H6121 Impacted cerumen, right ear: Secondary | ICD-10-CM | POA: Diagnosis not present

## 2020-07-04 DIAGNOSIS — F330 Major depressive disorder, recurrent, mild: Secondary | ICD-10-CM | POA: Diagnosis not present

## 2020-07-06 DIAGNOSIS — M5459 Other low back pain: Secondary | ICD-10-CM | POA: Diagnosis not present

## 2020-07-11 DIAGNOSIS — H6123 Impacted cerumen, bilateral: Secondary | ICD-10-CM | POA: Diagnosis not present

## 2020-07-11 DIAGNOSIS — H938X3 Other specified disorders of ear, bilateral: Secondary | ICD-10-CM | POA: Diagnosis not present

## 2020-07-11 DIAGNOSIS — H906 Mixed conductive and sensorineural hearing loss, bilateral: Secondary | ICD-10-CM | POA: Diagnosis not present

## 2020-07-13 DIAGNOSIS — M5459 Other low back pain: Secondary | ICD-10-CM | POA: Diagnosis not present

## 2020-07-18 DIAGNOSIS — F411 Generalized anxiety disorder: Secondary | ICD-10-CM | POA: Diagnosis not present

## 2020-07-18 DIAGNOSIS — F330 Major depressive disorder, recurrent, mild: Secondary | ICD-10-CM | POA: Diagnosis not present

## 2020-07-18 DIAGNOSIS — F4321 Adjustment disorder with depressed mood: Secondary | ICD-10-CM | POA: Diagnosis not present

## 2020-07-20 ENCOUNTER — Other Ambulatory Visit: Payer: Self-pay | Admitting: Physician Assistant

## 2020-07-20 DIAGNOSIS — M5459 Other low back pain: Secondary | ICD-10-CM | POA: Diagnosis not present

## 2020-07-20 DIAGNOSIS — H918X9 Other specified hearing loss, unspecified ear: Secondary | ICD-10-CM

## 2020-07-20 DIAGNOSIS — H903 Sensorineural hearing loss, bilateral: Secondary | ICD-10-CM | POA: Insufficient documentation

## 2020-07-26 DIAGNOSIS — M5459 Other low back pain: Secondary | ICD-10-CM | POA: Diagnosis not present

## 2020-08-03 DIAGNOSIS — M5459 Other low back pain: Secondary | ICD-10-CM | POA: Diagnosis not present

## 2020-08-04 DIAGNOSIS — R21 Rash and other nonspecific skin eruption: Secondary | ICD-10-CM | POA: Diagnosis not present

## 2020-08-04 DIAGNOSIS — T50905A Adverse effect of unspecified drugs, medicaments and biological substances, initial encounter: Secondary | ICD-10-CM | POA: Diagnosis not present

## 2020-08-08 DIAGNOSIS — F4321 Adjustment disorder with depressed mood: Secondary | ICD-10-CM | POA: Diagnosis not present

## 2020-08-08 DIAGNOSIS — F411 Generalized anxiety disorder: Secondary | ICD-10-CM | POA: Diagnosis not present

## 2020-08-08 DIAGNOSIS — F330 Major depressive disorder, recurrent, mild: Secondary | ICD-10-CM | POA: Diagnosis not present

## 2020-08-10 DIAGNOSIS — M5459 Other low back pain: Secondary | ICD-10-CM | POA: Diagnosis not present

## 2020-08-13 ENCOUNTER — Other Ambulatory Visit: Payer: Medicare Other

## 2020-08-17 DIAGNOSIS — M5459 Other low back pain: Secondary | ICD-10-CM | POA: Diagnosis not present

## 2020-08-19 ENCOUNTER — Other Ambulatory Visit: Payer: Medicare Other

## 2020-08-24 DIAGNOSIS — M5459 Other low back pain: Secondary | ICD-10-CM | POA: Diagnosis not present

## 2020-08-26 ENCOUNTER — Other Ambulatory Visit: Payer: PPO

## 2020-08-29 ENCOUNTER — Encounter: Payer: Self-pay | Admitting: Podiatrist

## 2020-08-29 ENCOUNTER — Ambulatory Visit: Payer: PPO | Admitting: Podiatrist

## 2020-08-29 ENCOUNTER — Other Ambulatory Visit: Payer: Self-pay

## 2020-08-29 DIAGNOSIS — L84 Corns and callosities: Secondary | ICD-10-CM

## 2020-08-29 DIAGNOSIS — M21619 Bunion of unspecified foot: Secondary | ICD-10-CM | POA: Diagnosis not present

## 2020-08-29 DIAGNOSIS — I7300 Raynaud's syndrome without gangrene: Secondary | ICD-10-CM

## 2020-08-29 DIAGNOSIS — F4321 Adjustment disorder with depressed mood: Secondary | ICD-10-CM | POA: Diagnosis not present

## 2020-08-29 DIAGNOSIS — F411 Generalized anxiety disorder: Secondary | ICD-10-CM | POA: Diagnosis not present

## 2020-08-29 DIAGNOSIS — F330 Major depressive disorder, recurrent, mild: Secondary | ICD-10-CM | POA: Diagnosis not present

## 2020-08-29 NOTE — Progress Notes (Unsigned)
   Chief Complaint  Patient presents with  . Callouses     right foot pain/callus pain     HPI: Patient is 70 y.o. female who presents today for a painful area on the medial aspect of the right first metatarsal head.  She relates she was seen several years ago by Dr. Amalia Hailey and he removed the sore spot and she felt better.  She relates it has recently become painful after wearing a pair of rain boots that may have been too narrow.     Allergies  Allergen Reactions  . Gluten Meal Other (See Comments)  . Penicillins     Review of systems is reviewed and negative.   Physical Exam  Patient is awake, alert, and oriented x 3.  In no acute distress.    Vascular status is intact with palpable pedal pulses DP and PT bilateral and capillary refill time less than 3 seconds bilateral.  Some purplish/pinikish discoloration was noted on the plantar left foot after having her foot in a dependent position while I was working on the right foot consistent with Raynauds-  She states she has been diagnosed with raynauds disease in the past.  Neurological exam reveals epicritic and protective sensation grossly intact bilateral.   Dermatological exam reveals skin is supple and dry to bilateral feet.  Well circumscribed small porokeratotic lesion present on the medial head of the first metatarsal.  Upon debridement, no bleeding or discomfort reported.      Musculoskeletal exam: Musculature intact with dorsiflexion, plantarflexion, inversion, eversion. Moderate bunion deformity is present right foot.      Assessment:   ICD-10-CM   1. Callus of foot  L84   2. Bunion  M21.619   3. Raynaud's phenomenon without gangrene  I73.00      Plan: - treatment options and alternatives discussed.  - Discussed conservative options vs. Surgical intervention of correcting the bunion if the problem persists and she her problem fails to resolve with conservative therapies. - I pared the lesion with a 15 blade and  excised the small core.   - padding was also discussed for her use in the future.  - she will return as needed for follow up.

## 2020-08-29 NOTE — Patient Instructions (Signed)
Corns and Calluses Corns are small areas of thickened skin that form on the top, sides, or tip of a toe. Corns have a cone-shaped core with a point that can press on a nerve below. This causes pain. Calluses are areas of thickened skin that can form anywhere on the body, including the hands, fingers, palms, soles of the feet, and heels. Calluses are usually larger than corns. What are the causes? Corns and calluses are caused by rubbing (friction) or pressure, such as from shoes that are too tight or do not fit properly. What increases the risk? Corns are more likely to develop in people who have misshapen toes (toe deformities), such as hammer toes. Calluses can form with friction to any area of the skin. They are more likely to develop in people who:  Work with their hands.  Wear shoes that fit poorly, are too tight, or are high-heeled.  Have toe deformities. What are the signs or symptoms? Symptoms of a corn or callus include:  A hard growth on the skin.  Pain or tenderness under the skin.  Redness and swelling.  Increased discomfort while wearing tight-fitting shoes, if your feet are affected. If a corn or callus becomes infected, symptoms may include:  Redness and swelling that gets worse.  Pain.  Fluid, blood, or pus draining from the corn or callus.   How is this diagnosed? Corns and calluses may be diagnosed based on your symptoms, your medical history, and a physical exam. How is this treated? Treatment for corns and calluses may include:  Removing the cause of the friction or pressure. This may involve: ? Changing your shoes. ? Wearing shoe inserts (orthotics) or other protective layers in your shoes, such as a corn pad. ? Wearing gloves.  Applying medicine to the skin (topical medicine) to help soften skin in the hardened, thickened areas.  Removing layers of dead skin with a file to reduce the size of the corn or callus.  Removing the corn or callus with a  scalpel or laser.  Taking antibiotic medicines, if your corn or callus is infected.  Having surgery, if a toe deformity is the cause. Follow these instructions at home:  Take over-the-counter and prescription medicines only as told by your health care provider.  If you were prescribed an antibiotic medicine, take it as told by your health care provider. Do not stop taking it even if your condition improves.  Wear shoes that fit well. Avoid wearing high-heeled shoes and shoes that are too tight or too loose.  Wear any padding, protective layers, gloves, or orthotics as told by your health care provider.  Soak your hands or feet. Then use a file or pumice stone to soften your corn or callus. Do this as told by your health care provider.  Check your corn or callus every day for signs of infection.   Contact a health care provider if:  Your symptoms do not improve with treatment.  You have redness or swelling that gets worse.  Your corn or callus becomes painful.  You have fluid, blood, or pus coming from your corn or callus.  You have new symptoms. Get help right away if:  You develop severe pain with redness. Summary  Corns are small areas of thickened skin that form on the top, sides, or tip of a toe. These can be painful.  Calluses are areas of thickened skin that can form anywhere on the body, including the hands, fingers, palms, and soles of the   feet. Calluses are usually larger than corns.  Corns and calluses are caused by rubbing (friction) or pressure, such as from shoes that are too tight or do not fit properly.  Treatment may include wearing padding, protective layers, gloves, or orthotics as told by your health care provider. This information is not intended to replace advice given to you by your health care provider. Make sure you discuss any questions you have with your health care provider. Document Revised: 09/23/2019 Document Reviewed: 09/23/2019 Elsevier  Patient Education  2021 Elsevier Inc.  

## 2020-08-30 DIAGNOSIS — M5459 Other low back pain: Secondary | ICD-10-CM | POA: Diagnosis not present

## 2020-09-06 DIAGNOSIS — M5459 Other low back pain: Secondary | ICD-10-CM | POA: Diagnosis not present

## 2020-09-14 DIAGNOSIS — M5459 Other low back pain: Secondary | ICD-10-CM | POA: Diagnosis not present

## 2020-09-15 DIAGNOSIS — N951 Menopausal and female climacteric states: Secondary | ICD-10-CM | POA: Diagnosis not present

## 2020-09-19 DIAGNOSIS — F4321 Adjustment disorder with depressed mood: Secondary | ICD-10-CM | POA: Diagnosis not present

## 2020-09-19 DIAGNOSIS — F330 Major depressive disorder, recurrent, mild: Secondary | ICD-10-CM | POA: Diagnosis not present

## 2020-09-19 DIAGNOSIS — F411 Generalized anxiety disorder: Secondary | ICD-10-CM | POA: Diagnosis not present

## 2020-09-27 DIAGNOSIS — M5459 Other low back pain: Secondary | ICD-10-CM | POA: Diagnosis not present

## 2020-10-05 DIAGNOSIS — M5459 Other low back pain: Secondary | ICD-10-CM | POA: Diagnosis not present

## 2020-10-05 DIAGNOSIS — Z1231 Encounter for screening mammogram for malignant neoplasm of breast: Secondary | ICD-10-CM | POA: Diagnosis not present

## 2020-10-05 DIAGNOSIS — Z6825 Body mass index (BMI) 25.0-25.9, adult: Secondary | ICD-10-CM | POA: Diagnosis not present

## 2020-10-05 DIAGNOSIS — Z01419 Encounter for gynecological examination (general) (routine) without abnormal findings: Secondary | ICD-10-CM | POA: Diagnosis not present

## 2020-10-11 DIAGNOSIS — M5459 Other low back pain: Secondary | ICD-10-CM | POA: Diagnosis not present

## 2020-10-24 DIAGNOSIS — F4321 Adjustment disorder with depressed mood: Secondary | ICD-10-CM | POA: Diagnosis not present

## 2020-10-25 DIAGNOSIS — M5459 Other low back pain: Secondary | ICD-10-CM | POA: Diagnosis not present

## 2020-10-27 DIAGNOSIS — Z0184 Encounter for antibody response examination: Secondary | ICD-10-CM | POA: Diagnosis not present

## 2020-10-30 DIAGNOSIS — M5459 Other low back pain: Secondary | ICD-10-CM | POA: Diagnosis not present

## 2020-11-03 DIAGNOSIS — E039 Hypothyroidism, unspecified: Secondary | ICD-10-CM | POA: Diagnosis not present

## 2020-11-07 DIAGNOSIS — M5459 Other low back pain: Secondary | ICD-10-CM | POA: Diagnosis not present

## 2020-11-10 DIAGNOSIS — K900 Celiac disease: Secondary | ICD-10-CM | POA: Diagnosis not present

## 2020-11-10 DIAGNOSIS — E039 Hypothyroidism, unspecified: Secondary | ICD-10-CM | POA: Diagnosis not present

## 2020-11-14 DIAGNOSIS — R140 Abdominal distension (gaseous): Secondary | ICD-10-CM | POA: Diagnosis not present

## 2020-11-14 DIAGNOSIS — K580 Irritable bowel syndrome with diarrhea: Secondary | ICD-10-CM | POA: Diagnosis not present

## 2020-11-14 DIAGNOSIS — K900 Celiac disease: Secondary | ICD-10-CM | POA: Diagnosis not present

## 2020-11-24 DIAGNOSIS — M5459 Other low back pain: Secondary | ICD-10-CM | POA: Diagnosis not present

## 2020-11-26 DIAGNOSIS — Z20822 Contact with and (suspected) exposure to covid-19: Secondary | ICD-10-CM | POA: Diagnosis not present

## 2020-11-28 DIAGNOSIS — U071 COVID-19: Secondary | ICD-10-CM | POA: Diagnosis not present

## 2020-12-02 DIAGNOSIS — U071 COVID-19: Secondary | ICD-10-CM | POA: Diagnosis not present

## 2020-12-14 DIAGNOSIS — M5459 Other low back pain: Secondary | ICD-10-CM | POA: Diagnosis not present

## 2020-12-20 DIAGNOSIS — M5459 Other low back pain: Secondary | ICD-10-CM | POA: Diagnosis not present

## 2020-12-27 DIAGNOSIS — M5459 Other low back pain: Secondary | ICD-10-CM | POA: Diagnosis not present

## 2021-01-01 DIAGNOSIS — L918 Other hypertrophic disorders of the skin: Secondary | ICD-10-CM | POA: Diagnosis not present

## 2021-01-01 DIAGNOSIS — B078 Other viral warts: Secondary | ICD-10-CM | POA: Diagnosis not present

## 2021-01-01 DIAGNOSIS — D1801 Hemangioma of skin and subcutaneous tissue: Secondary | ICD-10-CM | POA: Diagnosis not present

## 2021-01-01 DIAGNOSIS — Z85828 Personal history of other malignant neoplasm of skin: Secondary | ICD-10-CM | POA: Diagnosis not present

## 2021-01-01 DIAGNOSIS — L821 Other seborrheic keratosis: Secondary | ICD-10-CM | POA: Diagnosis not present

## 2021-01-02 DIAGNOSIS — M5459 Other low back pain: Secondary | ICD-10-CM | POA: Diagnosis not present

## 2021-01-09 DIAGNOSIS — K602 Anal fissure, unspecified: Secondary | ICD-10-CM | POA: Diagnosis not present

## 2021-01-09 DIAGNOSIS — K582 Mixed irritable bowel syndrome: Secondary | ICD-10-CM | POA: Diagnosis not present

## 2021-01-09 DIAGNOSIS — M5459 Other low back pain: Secondary | ICD-10-CM | POA: Diagnosis not present

## 2021-01-09 DIAGNOSIS — K900 Celiac disease: Secondary | ICD-10-CM | POA: Diagnosis not present

## 2021-01-16 DIAGNOSIS — M5459 Other low back pain: Secondary | ICD-10-CM | POA: Diagnosis not present

## 2021-01-18 DIAGNOSIS — F4321 Adjustment disorder with depressed mood: Secondary | ICD-10-CM | POA: Diagnosis not present

## 2021-01-23 DIAGNOSIS — M25512 Pain in left shoulder: Secondary | ICD-10-CM | POA: Diagnosis not present

## 2021-01-23 DIAGNOSIS — M5459 Other low back pain: Secondary | ICD-10-CM | POA: Diagnosis not present

## 2021-01-30 DIAGNOSIS — M25512 Pain in left shoulder: Secondary | ICD-10-CM | POA: Diagnosis not present

## 2021-01-30 DIAGNOSIS — M5459 Other low back pain: Secondary | ICD-10-CM | POA: Diagnosis not present

## 2021-02-07 DIAGNOSIS — M25512 Pain in left shoulder: Secondary | ICD-10-CM | POA: Diagnosis not present

## 2021-02-07 DIAGNOSIS — M5459 Other low back pain: Secondary | ICD-10-CM | POA: Diagnosis not present

## 2021-02-13 DIAGNOSIS — M25512 Pain in left shoulder: Secondary | ICD-10-CM | POA: Diagnosis not present

## 2021-02-13 DIAGNOSIS — M5459 Other low back pain: Secondary | ICD-10-CM | POA: Diagnosis not present

## 2021-03-05 DIAGNOSIS — M25512 Pain in left shoulder: Secondary | ICD-10-CM | POA: Diagnosis not present

## 2021-03-05 DIAGNOSIS — M5459 Other low back pain: Secondary | ICD-10-CM | POA: Diagnosis not present

## 2021-03-07 DIAGNOSIS — Z85828 Personal history of other malignant neoplasm of skin: Secondary | ICD-10-CM | POA: Diagnosis not present

## 2021-03-07 DIAGNOSIS — L738 Other specified follicular disorders: Secondary | ICD-10-CM | POA: Diagnosis not present

## 2021-03-07 DIAGNOSIS — B078 Other viral warts: Secondary | ICD-10-CM | POA: Diagnosis not present

## 2021-03-07 DIAGNOSIS — L821 Other seborrheic keratosis: Secondary | ICD-10-CM | POA: Diagnosis not present

## 2021-03-13 DIAGNOSIS — M5459 Other low back pain: Secondary | ICD-10-CM | POA: Diagnosis not present

## 2021-03-13 DIAGNOSIS — M25512 Pain in left shoulder: Secondary | ICD-10-CM | POA: Diagnosis not present

## 2021-03-20 DIAGNOSIS — M5459 Other low back pain: Secondary | ICD-10-CM | POA: Diagnosis not present

## 2021-03-20 DIAGNOSIS — M25512 Pain in left shoulder: Secondary | ICD-10-CM | POA: Diagnosis not present

## 2021-03-27 ENCOUNTER — Encounter: Payer: Self-pay | Admitting: Podiatry

## 2021-03-27 ENCOUNTER — Other Ambulatory Visit: Payer: Self-pay

## 2021-03-27 ENCOUNTER — Ambulatory Visit: Payer: PPO | Admitting: Podiatry

## 2021-03-27 DIAGNOSIS — J301 Allergic rhinitis due to pollen: Secondary | ICD-10-CM | POA: Insufficient documentation

## 2021-03-27 DIAGNOSIS — G43109 Migraine with aura, not intractable, without status migrainosus: Secondary | ICD-10-CM | POA: Insufficient documentation

## 2021-03-27 DIAGNOSIS — M5459 Other low back pain: Secondary | ICD-10-CM | POA: Diagnosis not present

## 2021-03-27 DIAGNOSIS — H9190 Unspecified hearing loss, unspecified ear: Secondary | ICD-10-CM | POA: Insufficient documentation

## 2021-03-27 DIAGNOSIS — K900 Celiac disease: Secondary | ICD-10-CM | POA: Insufficient documentation

## 2021-03-27 DIAGNOSIS — L600 Ingrowing nail: Secondary | ICD-10-CM

## 2021-03-27 DIAGNOSIS — F419 Anxiety disorder, unspecified: Secondary | ICD-10-CM | POA: Insufficient documentation

## 2021-03-27 DIAGNOSIS — E559 Vitamin D deficiency, unspecified: Secondary | ICD-10-CM | POA: Insufficient documentation

## 2021-03-27 DIAGNOSIS — E039 Hypothyroidism, unspecified: Secondary | ICD-10-CM | POA: Insufficient documentation

## 2021-03-27 DIAGNOSIS — J45909 Unspecified asthma, uncomplicated: Secondary | ICD-10-CM | POA: Insufficient documentation

## 2021-03-27 DIAGNOSIS — M5090 Cervical disc disorder, unspecified, unspecified cervical region: Secondary | ICD-10-CM | POA: Insufficient documentation

## 2021-03-27 DIAGNOSIS — M25512 Pain in left shoulder: Secondary | ICD-10-CM | POA: Diagnosis not present

## 2021-03-27 NOTE — Patient Instructions (Signed)
Ingrown Toenail An ingrown toenail occurs when the corner or sides of a toenail grow into the surrounding skin. This causes discomfort and pain. The big toe is most commonly affected, but any of the toes can be affected. If an ingrown toenail is nottreated, it can become infected. What are the causes? This condition may be caused by: Wearing shoes that are too small or tight. An injury, such as stubbing your toe or having your toe stepped on. Improper cutting or care of your toenails. Having nail or foot abnormalities that were present from birth (congenital abnormalities), such as having a nail that is too big for your toe. What increases the risk? The following factors may make you more likely to develop ingrown toenails: Age. Nails tend to get thicker with age, so ingrown nails are more common among older people. Cutting your toenails incorrectly, such as cutting them very short or cutting them unevenly. An ingrown toenail is more likely to get infected if you have: Diabetes. Blood flow (circulation) problems. What are the signs or symptoms? Symptoms of an ingrown toenail may include: Pain, soreness, or tenderness. Redness. Swelling. Hardening of the skin that surrounds the toenail. Signs that an ingrown toenail may be infected include: Fluid or pus. Symptoms that get worse instead of better. How is this diagnosed? An ingrown toenail may be diagnosed based on your medical history, your symptoms, and a physical exam. If you have fluid or blood coming from your toenail, a sample may be collected to test for the specific type of bacteriathat is causing the infection. How is this treated? Treatment depends on how severe your ingrown toenail is. You may be able to care for your toenail at home. If you have an infection, you may be prescribed antibiotic medicines. If you have fluid or pus draining from your toenail, your health care provider may drain it. If you have trouble walking, you  may be given crutches to use. If you have a severe or infected ingrown toenail, you may need a procedure to remove part or all of the nail. Follow these instructions at home: Foot care  Do not pick at your toenail or try to remove it yourself. Soak your foot in warm, soapy water. Do this for 20 minutes, 3 times a day, or as often as told by your health care provider. This helps to keep your toe clean and keep your skin soft. Wear shoes that fit well and are not too tight. Your health care provider may recommend that you wear open-toed shoes while you heal. Trim your toenails regularly and carefully. Cut your toenails straight across to prevent injury to the skin at the corners of the toenail. Do not cut your nails in a curved shape. Keep your feet clean and dry to help prevent infection.  Medicines Take over-the-counter and prescription medicines only as told by your health care provider. If you were prescribed an antibiotic, take it as told by your health care provider. Do not stop taking the antibiotic even if you start to feel better. Activity Return to your normal activities as told by your health care provider. Ask your health care provider what activities are safe for you. Avoid activities that cause pain. General instructions If your health care provider told you to use crutches to help you move around, use them as instructed. Keep all follow-up visits as told by your health care provider. This is important. Contact a health care provider if: You have more redness, swelling, pain, or   other symptoms that do not improve with treatment. You have fluid, blood, or pus coming from your toenail. Get help right away if: You have a red streak on your skin that starts at your foot and spreads up your leg. You have a fever. Summary An ingrown toenail occurs when the corner or sides of a toenail grow into the surrounding skin. This causes discomfort and pain. The big toe is most commonly  affected, but any of the toes can be affected. If an ingrown toenail is not treated, it can become infected. Fluid or pus draining from your toenail is a sign of infection. Your health care provider may need to drain it. You may be given antibiotics to treat the infection. Trimming your toenails regularly and properly can help you prevent an ingrown toenail. This information is not intended to replace advice given to you by your health care provider. Make sure you discuss any questions you have with your healthcare provider. Document Revised: 09/18/2018 Document Reviewed: 02/12/2017 Elsevier Patient Education  2021 Elsevier Inc.  

## 2021-04-03 DIAGNOSIS — M5459 Other low back pain: Secondary | ICD-10-CM | POA: Diagnosis not present

## 2021-04-03 DIAGNOSIS — M25512 Pain in left shoulder: Secondary | ICD-10-CM | POA: Diagnosis not present

## 2021-04-03 NOTE — Progress Notes (Signed)
Subjective: 70 year old female presents the office today evaluation of ingrown toenails.  She wants to have them looked and she was concerned on the move but wants to become for the first year.  No swelling redness or drainage.  She does get pedicures.  She has not noticed any signs of infection.  She has no other concerns.   Objective: AAO x3, NAD DP/PT pulses palpable bilaterally, CRT less than 3 seconds Mild incurvation present along the left medial hallux toenail without any edema, erythema, drainage or pus or any signs of infection.  There is no other areas of discomfort identified.  No open lesions.  MMT 5/5. No pain with calf compression, swelling, warmth, erythema  Assessment: Asymptomatic ingrown toenail left hallux  Plan: -All treatment options discussed with the patient including all alternatives, risks, complications. -At this time there is no signs of infection or pain.  We discussed partial nail avulsion but she wants to consider this in the next couple months if it becomes problematic.  For now continue regular debridement.  Monitor for any signs or symptoms of infection.  Return if symptoms worsen or fail to improve.  Trula Slade DPM

## 2021-04-10 DIAGNOSIS — M25512 Pain in left shoulder: Secondary | ICD-10-CM | POA: Diagnosis not present

## 2021-04-10 DIAGNOSIS — F4321 Adjustment disorder with depressed mood: Secondary | ICD-10-CM | POA: Diagnosis not present

## 2021-04-10 DIAGNOSIS — M5459 Other low back pain: Secondary | ICD-10-CM | POA: Diagnosis not present

## 2021-04-17 DIAGNOSIS — M25512 Pain in left shoulder: Secondary | ICD-10-CM | POA: Diagnosis not present

## 2021-04-17 DIAGNOSIS — M5459 Other low back pain: Secondary | ICD-10-CM | POA: Diagnosis not present

## 2021-04-25 DIAGNOSIS — R7309 Other abnormal glucose: Secondary | ICD-10-CM | POA: Diagnosis not present

## 2021-04-25 DIAGNOSIS — F419 Anxiety disorder, unspecified: Secondary | ICD-10-CM | POA: Diagnosis not present

## 2021-04-25 DIAGNOSIS — Z79899 Other long term (current) drug therapy: Secondary | ICD-10-CM | POA: Diagnosis not present

## 2021-04-25 DIAGNOSIS — M5030 Other cervical disc degeneration, unspecified cervical region: Secondary | ICD-10-CM | POA: Diagnosis not present

## 2021-04-25 DIAGNOSIS — E559 Vitamin D deficiency, unspecified: Secondary | ICD-10-CM | POA: Diagnosis not present

## 2021-04-25 DIAGNOSIS — E039 Hypothyroidism, unspecified: Secondary | ICD-10-CM | POA: Diagnosis not present

## 2021-04-25 DIAGNOSIS — R7301 Impaired fasting glucose: Secondary | ICD-10-CM | POA: Diagnosis not present

## 2021-04-25 DIAGNOSIS — E7800 Pure hypercholesterolemia, unspecified: Secondary | ICD-10-CM | POA: Diagnosis not present

## 2021-04-25 DIAGNOSIS — J45909 Unspecified asthma, uncomplicated: Secondary | ICD-10-CM | POA: Diagnosis not present

## 2021-04-25 DIAGNOSIS — Z0001 Encounter for general adult medical examination with abnormal findings: Secondary | ICD-10-CM | POA: Diagnosis not present

## 2021-04-26 ENCOUNTER — Other Ambulatory Visit (HOSPITAL_COMMUNITY): Payer: Self-pay | Admitting: Family Medicine

## 2021-04-26 DIAGNOSIS — M5459 Other low back pain: Secondary | ICD-10-CM | POA: Diagnosis not present

## 2021-04-26 DIAGNOSIS — M25512 Pain in left shoulder: Secondary | ICD-10-CM | POA: Diagnosis not present

## 2021-05-02 DIAGNOSIS — M5459 Other low back pain: Secondary | ICD-10-CM | POA: Diagnosis not present

## 2021-05-02 DIAGNOSIS — M25512 Pain in left shoulder: Secondary | ICD-10-CM | POA: Diagnosis not present

## 2021-05-09 DIAGNOSIS — M25512 Pain in left shoulder: Secondary | ICD-10-CM | POA: Diagnosis not present

## 2021-05-09 DIAGNOSIS — M5459 Other low back pain: Secondary | ICD-10-CM | POA: Diagnosis not present

## 2021-05-10 DIAGNOSIS — E039 Hypothyroidism, unspecified: Secondary | ICD-10-CM | POA: Diagnosis not present

## 2021-05-14 ENCOUNTER — Ambulatory Visit (HOSPITAL_COMMUNITY)
Admission: RE | Admit: 2021-05-14 | Discharge: 2021-05-14 | Disposition: A | Discharge status: Home / Self Care | Payer: Self-pay | Source: Ambulatory Visit | Attending: Family Medicine | Admitting: Family Medicine

## 2021-05-14 ENCOUNTER — Other Ambulatory Visit: Payer: Self-pay

## 2021-05-14 DIAGNOSIS — Z136 Encounter for screening for cardiovascular disorders: Secondary | ICD-10-CM | POA: Insufficient documentation

## 2021-05-14 DIAGNOSIS — E039 Hypothyroidism, unspecified: Secondary | ICD-10-CM | POA: Diagnosis not present

## 2021-05-14 DIAGNOSIS — K900 Celiac disease: Secondary | ICD-10-CM | POA: Diagnosis not present

## 2021-05-14 DIAGNOSIS — E7800 Pure hypercholesterolemia, unspecified: Secondary | ICD-10-CM | POA: Insufficient documentation

## 2021-05-16 DIAGNOSIS — M25512 Pain in left shoulder: Secondary | ICD-10-CM | POA: Diagnosis not present

## 2021-05-16 DIAGNOSIS — M5459 Other low back pain: Secondary | ICD-10-CM | POA: Diagnosis not present

## 2021-05-23 DIAGNOSIS — M25512 Pain in left shoulder: Secondary | ICD-10-CM | POA: Diagnosis not present

## 2021-05-23 DIAGNOSIS — M5459 Other low back pain: Secondary | ICD-10-CM | POA: Diagnosis not present

## 2021-05-28 DIAGNOSIS — Z23 Encounter for immunization: Secondary | ICD-10-CM | POA: Diagnosis not present

## 2021-05-30 DIAGNOSIS — M25512 Pain in left shoulder: Secondary | ICD-10-CM | POA: Diagnosis not present

## 2021-05-30 DIAGNOSIS — M5459 Other low back pain: Secondary | ICD-10-CM | POA: Diagnosis not present

## 2021-06-06 DIAGNOSIS — M25512 Pain in left shoulder: Secondary | ICD-10-CM | POA: Diagnosis not present

## 2021-06-06 DIAGNOSIS — M5459 Other low back pain: Secondary | ICD-10-CM | POA: Diagnosis not present

## 2021-06-12 DIAGNOSIS — M5459 Other low back pain: Secondary | ICD-10-CM | POA: Diagnosis not present

## 2021-06-12 DIAGNOSIS — M25512 Pain in left shoulder: Secondary | ICD-10-CM | POA: Diagnosis not present

## 2021-06-13 DIAGNOSIS — M25512 Pain in left shoulder: Secondary | ICD-10-CM | POA: Diagnosis not present

## 2021-06-13 DIAGNOSIS — M5459 Other low back pain: Secondary | ICD-10-CM | POA: Diagnosis not present

## 2021-06-19 ENCOUNTER — Ambulatory Visit: Payer: PPO | Admitting: Podiatry

## 2021-06-20 DIAGNOSIS — M25512 Pain in left shoulder: Secondary | ICD-10-CM | POA: Diagnosis not present

## 2021-06-20 DIAGNOSIS — M5459 Other low back pain: Secondary | ICD-10-CM | POA: Diagnosis not present

## 2021-06-25 DIAGNOSIS — M25562 Pain in left knee: Secondary | ICD-10-CM | POA: Diagnosis not present

## 2021-06-25 DIAGNOSIS — M94262 Chondromalacia, left knee: Secondary | ICD-10-CM | POA: Diagnosis not present

## 2021-06-27 DIAGNOSIS — M25562 Pain in left knee: Secondary | ICD-10-CM | POA: Diagnosis not present

## 2021-06-27 DIAGNOSIS — M5459 Other low back pain: Secondary | ICD-10-CM | POA: Diagnosis not present

## 2021-06-27 DIAGNOSIS — M25512 Pain in left shoulder: Secondary | ICD-10-CM | POA: Diagnosis not present

## 2021-07-04 DIAGNOSIS — M25512 Pain in left shoulder: Secondary | ICD-10-CM | POA: Diagnosis not present

## 2021-07-04 DIAGNOSIS — M5459 Other low back pain: Secondary | ICD-10-CM | POA: Diagnosis not present

## 2021-07-04 DIAGNOSIS — M25562 Pain in left knee: Secondary | ICD-10-CM | POA: Diagnosis not present

## 2021-07-09 ENCOUNTER — Other Ambulatory Visit: Payer: Self-pay

## 2021-07-09 ENCOUNTER — Ambulatory Visit (INDEPENDENT_AMBULATORY_CARE_PROVIDER_SITE_OTHER): Payer: Medicare Other | Admitting: Podiatry

## 2021-07-09 DIAGNOSIS — L600 Ingrowing nail: Secondary | ICD-10-CM

## 2021-07-09 NOTE — Progress Notes (Signed)
Subjective: 71 year old female presents the office today evaluation of ingrown toenail to the left big toe.  She states that she gets pedicures and gets them trimmed out occasionally.  It causes tenderness at times.  No swelling redness or any drainage.  She just wants to discuss the procedure but does not want it performed today.  Objective: AAO x3, NAD DP/PT pulses palpable bilaterally, CRT less than 3 seconds Mild incurvation present along the left medial hallux toenail without any edema, erythema, drainage or pus or any signs of infection.  There is no other areas of discomfort identified.  No open lesions.  Bunions present.  MMT 5/5. No pain with calf compression, swelling, warmth, erythema  Assessment: Asymptomatic ingrown toenail left hallux  Plan: -All treatment options discussed with the patient including all alternatives, risks, complications. -No signs of infection or significant discomfort on exam today.  I discussed with her partial nail avulsions of both the medial lateral nail borders with chemical matricectomy.  We discussed the procedure was postoperative course.  She wants to pursue this likely over the summer for now she is can continue with routine debridements. Signs or signs of infection or pain.  Should any symptoms change to let me know.  Trula Slade DPM

## 2021-07-11 DIAGNOSIS — M25562 Pain in left knee: Secondary | ICD-10-CM | POA: Diagnosis not present

## 2021-07-11 DIAGNOSIS — M5459 Other low back pain: Secondary | ICD-10-CM | POA: Diagnosis not present

## 2021-07-11 DIAGNOSIS — M25512 Pain in left shoulder: Secondary | ICD-10-CM | POA: Diagnosis not present

## 2021-07-18 ENCOUNTER — Telehealth: Payer: Self-pay | Admitting: Podiatry

## 2021-07-18 ENCOUNTER — Other Ambulatory Visit: Payer: Self-pay | Admitting: Podiatry

## 2021-07-18 DIAGNOSIS — M25512 Pain in left shoulder: Secondary | ICD-10-CM | POA: Diagnosis not present

## 2021-07-18 DIAGNOSIS — M25562 Pain in left knee: Secondary | ICD-10-CM | POA: Diagnosis not present

## 2021-07-18 DIAGNOSIS — M5459 Other low back pain: Secondary | ICD-10-CM | POA: Diagnosis not present

## 2021-07-18 NOTE — Telephone Encounter (Signed)
Pharmacy : [facility] on [address]   Medication: diclofenac (VOLTAREN) 75 MG EC tablet  Bunion area is hurting and she puts this cream on it , so that she can wear her shoes

## 2021-07-18 NOTE — Telephone Encounter (Signed)
Patient notifiied that medication was called in .

## 2021-07-25 DIAGNOSIS — M25562 Pain in left knee: Secondary | ICD-10-CM | POA: Diagnosis not present

## 2021-07-25 DIAGNOSIS — M5459 Other low back pain: Secondary | ICD-10-CM | POA: Diagnosis not present

## 2021-07-25 DIAGNOSIS — M25512 Pain in left shoulder: Secondary | ICD-10-CM | POA: Diagnosis not present

## 2021-07-31 ENCOUNTER — Ambulatory Visit: Payer: Medicare Other | Admitting: Podiatry

## 2021-08-01 DIAGNOSIS — M5459 Other low back pain: Secondary | ICD-10-CM | POA: Diagnosis not present

## 2021-08-01 DIAGNOSIS — M25512 Pain in left shoulder: Secondary | ICD-10-CM | POA: Diagnosis not present

## 2021-08-01 DIAGNOSIS — M25562 Pain in left knee: Secondary | ICD-10-CM | POA: Diagnosis not present

## 2021-08-06 ENCOUNTER — Ambulatory Visit: Payer: Medicare Other | Admitting: Podiatry

## 2021-08-08 DIAGNOSIS — M25512 Pain in left shoulder: Secondary | ICD-10-CM | POA: Diagnosis not present

## 2021-08-08 DIAGNOSIS — M6281 Muscle weakness (generalized): Secondary | ICD-10-CM | POA: Diagnosis not present

## 2021-08-08 DIAGNOSIS — M25562 Pain in left knee: Secondary | ICD-10-CM | POA: Diagnosis not present

## 2021-08-08 DIAGNOSIS — M5451 Vertebrogenic low back pain: Secondary | ICD-10-CM | POA: Diagnosis not present

## 2021-08-08 DIAGNOSIS — M5459 Other low back pain: Secondary | ICD-10-CM | POA: Diagnosis not present

## 2021-08-13 DIAGNOSIS — N959 Unspecified menopausal and perimenopausal disorder: Secondary | ICD-10-CM | POA: Diagnosis not present

## 2021-08-15 DIAGNOSIS — M25512 Pain in left shoulder: Secondary | ICD-10-CM | POA: Diagnosis not present

## 2021-08-15 DIAGNOSIS — M6281 Muscle weakness (generalized): Secondary | ICD-10-CM | POA: Diagnosis not present

## 2021-08-15 DIAGNOSIS — M5451 Vertebrogenic low back pain: Secondary | ICD-10-CM | POA: Diagnosis not present

## 2021-08-15 DIAGNOSIS — M5459 Other low back pain: Secondary | ICD-10-CM | POA: Diagnosis not present

## 2021-08-15 DIAGNOSIS — M25562 Pain in left knee: Secondary | ICD-10-CM | POA: Diagnosis not present

## 2021-08-22 DIAGNOSIS — M25512 Pain in left shoulder: Secondary | ICD-10-CM | POA: Diagnosis not present

## 2021-08-22 DIAGNOSIS — M5451 Vertebrogenic low back pain: Secondary | ICD-10-CM | POA: Diagnosis not present

## 2021-08-22 DIAGNOSIS — M25562 Pain in left knee: Secondary | ICD-10-CM | POA: Diagnosis not present

## 2021-08-22 DIAGNOSIS — M6281 Muscle weakness (generalized): Secondary | ICD-10-CM | POA: Diagnosis not present

## 2021-08-22 DIAGNOSIS — M5459 Other low back pain: Secondary | ICD-10-CM | POA: Diagnosis not present

## 2021-08-27 ENCOUNTER — Ambulatory Visit (INDEPENDENT_AMBULATORY_CARE_PROVIDER_SITE_OTHER): Payer: Medicare Other | Admitting: Podiatry

## 2021-08-27 ENCOUNTER — Other Ambulatory Visit: Payer: Self-pay

## 2021-08-27 DIAGNOSIS — L84 Corns and callosities: Secondary | ICD-10-CM | POA: Diagnosis not present

## 2021-08-27 DIAGNOSIS — M21619 Bunion of unspecified foot: Secondary | ICD-10-CM | POA: Diagnosis not present

## 2021-08-27 DIAGNOSIS — L600 Ingrowing nail: Secondary | ICD-10-CM

## 2021-08-27 NOTE — Patient Instructions (Signed)
Ingrown Toenail ?An ingrown toenail occurs when the corner or sides of a toenail grow into the surrounding skin. This causes discomfort and pain. The big toe is most commonly affected, but any of the toes can be affected. If an ingrown toenail is not treated, it can become infected. ?What are the causes? ?This condition may be caused by: ?Wearing shoes that are too small or tight. ?An injury, such as stubbing your toe or having your toe stepped on. ?Improper cutting or care of your toenails. ?Having nail or foot abnormalities that were present from birth (congenital abnormalities), such as having a nail that is too big for your toe. ?What increases the risk? ?The following factors may make you more likely to develop ingrown toenails: ?Age. Nails tend to get thicker with age, so ingrown nails are more common among older people. ?Cutting your toenails incorrectly, such as cutting them very short or cutting them unevenly. ?An ingrown toenail is more likely to get infected if you have: ?Diabetes. ?Blood flow (circulation) problems. ?What are the signs or symptoms? ?Symptoms of an ingrown toenail may include: ?Pain, soreness, or tenderness. ?Redness. ?Swelling. ?Hardening of the skin that surrounds the toenail. ?Signs that an ingrown toenail may be infected include: ?Fluid or pus. ?Symptoms that get worse. ?How is this diagnosed? ?Ingrown toenails may be diagnosed based on: ?Your symptoms and medical history. ?A physical exam. ?Labs or tests. If you have fluid or blood coming from your toenail, a sample may be collected to test for the specific type of bacteria that is causing the infection. ?How is this treated? ?Treatment depends on the severity of your symptoms. You may be able to care for your toenail at home. ?If you have an infection, you may be prescribed antibiotic medicines. ?If you have fluid or pus draining from your toenail, your health care provider may drain it. ?If you have trouble walking, you may be  given crutches to use. ?If you have a severe or infected ingrown toenail, you may need a procedure to remove part or all of the nail. ?Follow these instructions at home: ?Foot care ? ?Check your wound every day for signs of infection, or as often as told by your health care provider. Check for: ?More redness, swelling, or pain. ?More fluid or blood. ?Warmth. ?Pus or a bad smell. ?Do not pick at your toenail or try to remove it yourself. ?Soak your foot in warm, soapy water. Do this for 20 minutes, 3 times a day, or as often as told by your health care provider. This helps to keep your toe clean and your skin soft. ?Wear shoes that fit well and are not too tight. Your health care provider may recommend that you wear open-toed shoes while you heal. ?Trim your toenails regularly and carefully. Cut your toenails straight across to prevent injury to the skin at the corners of the toenail. Do not cut your nails in a curved shape. ?Keep your feet clean and dry to help prevent infection. ?General instructions ?Take over-the-counter and prescription medicines only as told by your health care provider. ?If you were prescribed an antibiotic, take it as told by your health care provider. Do not stop taking the antibiotic even if you start to feel better. ?If your health care provider told you to use crutches to help you move around, use them as instructed. ?Return to your normal activities as told by your health care provider. Ask your health care provider what activities are safe for you. ?Keep   all follow-up visits. This is important. ?Contact a health care provider if: ?You have more redness, swelling, pain, or other symptoms that do not improve with treatment. ?You have fluid, blood, or pus coming from your toenail. ?You have a red streak on your skin that starts at your foot and spreads up your leg. ?You have a fever. ?Summary ?An ingrown toenail occurs when the corner or sides of a toenail grow into the surrounding skin.  This causes discomfort and pain. The big toe is most commonly affected, but any of the toes can be affected. ?If an ingrown toenail is not treated, it can become infected. ?Fluid or pus draining from your toenail is a sign of infection. Your health care provider may need to drain it. You may be given antibiotics to treat the infection. ?Trimming your toenails regularly and properly can help you prevent an ingrown toenail. ?This information is not intended to replace advice given to you by your health care provider. Make sure you discuss any questions you have with your health care provider. ?Document Revised: 09/26/2020 Document Reviewed: 09/26/2020 ?Elsevier Patient Education ? 2022 Elsevier Inc[employer] ? ?

## 2021-08-29 DIAGNOSIS — M5451 Vertebrogenic low back pain: Secondary | ICD-10-CM | POA: Diagnosis not present

## 2021-08-29 DIAGNOSIS — E039 Hypothyroidism, unspecified: Secondary | ICD-10-CM | POA: Diagnosis not present

## 2021-08-29 DIAGNOSIS — M25512 Pain in left shoulder: Secondary | ICD-10-CM | POA: Diagnosis not present

## 2021-08-29 DIAGNOSIS — M25562 Pain in left knee: Secondary | ICD-10-CM | POA: Diagnosis not present

## 2021-08-29 DIAGNOSIS — M6281 Muscle weakness (generalized): Secondary | ICD-10-CM | POA: Diagnosis not present

## 2021-08-29 DIAGNOSIS — M5459 Other low back pain: Secondary | ICD-10-CM | POA: Diagnosis not present

## 2021-08-29 DIAGNOSIS — R5383 Other fatigue: Secondary | ICD-10-CM | POA: Diagnosis not present

## 2021-09-03 DIAGNOSIS — M21619 Bunion of unspecified foot: Secondary | ICD-10-CM | POA: Insufficient documentation

## 2021-09-03 NOTE — Progress Notes (Signed)
Subjective: ?71 year old female presents the office today for concerns of right foot possible callus on the big toe which was starting.  She states that it gets a little red at times with pressure and with shoes.  Denies any swelling redness or any drainage otherwise.  She also has a callus forming on the ball of her left foot near the third and fourth toes.  She states that the pain that she was experiencing has resolved and she is not really having pain with them.  No injuries. ? ?Objective: ?AAO x3, NAD ?DP/PT pulses palpable bilaterally, CRT less than 3 seconds ?Bunion present in the right foot.  Hyperkeratotic lesion along the right hallux as well as submetatarsal 2 left foot.  There is no underlying ulceration drainage or signs of infection.  No edema, erythema.  Ingrown toenail left hallux without any significant pain, swelling, redness or any drainage.  No pain with calf compression, swelling, warmth, erythema ? ?Assessment: ?71 year old female with hyperkeratotic lesions, bunion, hammertoe ? ?Plan: ?-All treatment options discussed with the patient including all alternatives, risks, complications.  ?-As a courtesy debride the calluses and complications of bleeding.  Recommend moisturizer and offloading daily.  Continue with shoe modifications.  I think that she has that she is wearing is too big we discussed being fitted for shoes.  Consider ingrown toenail removal in the future if needed but she still wants follow-up on this.  We discussed surgery as well as conservative options for bunions.  She is in continue with conservative care for now. ?-Patient encouraged to call the office with any questions, concerns, change in symptoms.  ? ?Trula Slade DPM ? ?

## 2021-09-04 DIAGNOSIS — M6281 Muscle weakness (generalized): Secondary | ICD-10-CM | POA: Diagnosis not present

## 2021-09-04 DIAGNOSIS — M5451 Vertebrogenic low back pain: Secondary | ICD-10-CM | POA: Diagnosis not present

## 2021-09-04 DIAGNOSIS — M25512 Pain in left shoulder: Secondary | ICD-10-CM | POA: Diagnosis not present

## 2021-09-04 DIAGNOSIS — M5459 Other low back pain: Secondary | ICD-10-CM | POA: Diagnosis not present

## 2021-09-04 DIAGNOSIS — M25562 Pain in left knee: Secondary | ICD-10-CM | POA: Diagnosis not present

## 2021-09-10 DIAGNOSIS — N951 Menopausal and female climacteric states: Secondary | ICD-10-CM | POA: Diagnosis not present

## 2021-09-12 DIAGNOSIS — M5459 Other low back pain: Secondary | ICD-10-CM | POA: Diagnosis not present

## 2021-09-12 DIAGNOSIS — M25512 Pain in left shoulder: Secondary | ICD-10-CM | POA: Diagnosis not present

## 2021-09-12 DIAGNOSIS — M6281 Muscle weakness (generalized): Secondary | ICD-10-CM | POA: Diagnosis not present

## 2021-09-12 DIAGNOSIS — M5451 Vertebrogenic low back pain: Secondary | ICD-10-CM | POA: Diagnosis not present

## 2021-09-12 DIAGNOSIS — M25562 Pain in left knee: Secondary | ICD-10-CM | POA: Diagnosis not present

## 2021-09-19 DIAGNOSIS — M25562 Pain in left knee: Secondary | ICD-10-CM | POA: Diagnosis not present

## 2021-09-19 DIAGNOSIS — M6281 Muscle weakness (generalized): Secondary | ICD-10-CM | POA: Diagnosis not present

## 2021-09-19 DIAGNOSIS — M25512 Pain in left shoulder: Secondary | ICD-10-CM | POA: Diagnosis not present

## 2021-09-19 DIAGNOSIS — M5459 Other low back pain: Secondary | ICD-10-CM | POA: Diagnosis not present

## 2021-09-19 DIAGNOSIS — M5451 Vertebrogenic low back pain: Secondary | ICD-10-CM | POA: Diagnosis not present

## 2021-09-26 DIAGNOSIS — M25511 Pain in right shoulder: Secondary | ICD-10-CM | POA: Diagnosis not present

## 2021-09-27 DIAGNOSIS — H9072 Mixed conductive and sensorineural hearing loss, unilateral, left ear, with unrestricted hearing on the contralateral side: Secondary | ICD-10-CM | POA: Diagnosis not present

## 2021-09-27 DIAGNOSIS — R0989 Other specified symptoms and signs involving the circulatory and respiratory systems: Secondary | ICD-10-CM | POA: Diagnosis not present

## 2021-09-27 DIAGNOSIS — H6123 Impacted cerumen, bilateral: Secondary | ICD-10-CM | POA: Diagnosis not present

## 2021-10-03 DIAGNOSIS — M25511 Pain in right shoulder: Secondary | ICD-10-CM | POA: Diagnosis not present

## 2021-10-08 DIAGNOSIS — M25562 Pain in left knee: Secondary | ICD-10-CM | POA: Diagnosis not present

## 2021-10-08 DIAGNOSIS — M94262 Chondromalacia, left knee: Secondary | ICD-10-CM | POA: Diagnosis not present

## 2021-10-10 DIAGNOSIS — M25511 Pain in right shoulder: Secondary | ICD-10-CM | POA: Diagnosis not present

## 2021-10-17 DIAGNOSIS — Z1231 Encounter for screening mammogram for malignant neoplasm of breast: Secondary | ICD-10-CM | POA: Diagnosis not present

## 2021-10-17 DIAGNOSIS — Z124 Encounter for screening for malignant neoplasm of cervix: Secondary | ICD-10-CM | POA: Diagnosis not present

## 2021-10-17 DIAGNOSIS — Z79890 Hormone replacement therapy (postmenopausal): Secondary | ICD-10-CM | POA: Diagnosis not present

## 2021-10-17 DIAGNOSIS — Z6827 Body mass index (BMI) 27.0-27.9, adult: Secondary | ICD-10-CM | POA: Diagnosis not present

## 2021-10-17 DIAGNOSIS — Z01419 Encounter for gynecological examination (general) (routine) without abnormal findings: Secondary | ICD-10-CM | POA: Diagnosis not present

## 2021-10-23 DIAGNOSIS — G43109 Migraine with aura, not intractable, without status migrainosus: Secondary | ICD-10-CM | POA: Diagnosis not present

## 2021-10-23 DIAGNOSIS — F419 Anxiety disorder, unspecified: Secondary | ICD-10-CM | POA: Diagnosis not present

## 2021-10-23 DIAGNOSIS — E039 Hypothyroidism, unspecified: Secondary | ICD-10-CM | POA: Diagnosis not present

## 2021-10-31 DIAGNOSIS — M25511 Pain in right shoulder: Secondary | ICD-10-CM | POA: Diagnosis not present

## 2021-11-20 DIAGNOSIS — E039 Hypothyroidism, unspecified: Secondary | ICD-10-CM | POA: Diagnosis not present

## 2021-11-22 DIAGNOSIS — H903 Sensorineural hearing loss, bilateral: Secondary | ICD-10-CM | POA: Diagnosis not present

## 2021-11-27 DIAGNOSIS — K900 Celiac disease: Secondary | ICD-10-CM | POA: Diagnosis not present

## 2021-11-27 DIAGNOSIS — E039 Hypothyroidism, unspecified: Secondary | ICD-10-CM | POA: Diagnosis not present

## 2021-11-27 DIAGNOSIS — R5383 Other fatigue: Secondary | ICD-10-CM | POA: Diagnosis not present

## 2021-11-28 DIAGNOSIS — E349 Endocrine disorder, unspecified: Secondary | ICD-10-CM | POA: Diagnosis not present

## 2021-11-28 DIAGNOSIS — N952 Postmenopausal atrophic vaginitis: Secondary | ICD-10-CM | POA: Diagnosis not present

## 2021-11-28 DIAGNOSIS — G4700 Insomnia, unspecified: Secondary | ICD-10-CM | POA: Diagnosis not present

## 2021-12-20 DIAGNOSIS — H903 Sensorineural hearing loss, bilateral: Secondary | ICD-10-CM | POA: Diagnosis not present

## 2022-02-05 DIAGNOSIS — L821 Other seborrheic keratosis: Secondary | ICD-10-CM | POA: Diagnosis not present

## 2022-02-05 DIAGNOSIS — Z85828 Personal history of other malignant neoplasm of skin: Secondary | ICD-10-CM | POA: Diagnosis not present

## 2022-02-05 DIAGNOSIS — D224 Melanocytic nevi of scalp and neck: Secondary | ICD-10-CM | POA: Diagnosis not present

## 2022-02-05 DIAGNOSIS — D1801 Hemangioma of skin and subcutaneous tissue: Secondary | ICD-10-CM | POA: Diagnosis not present

## 2022-02-06 DIAGNOSIS — F339 Major depressive disorder, recurrent, unspecified: Secondary | ICD-10-CM | POA: Diagnosis not present

## 2022-03-07 DIAGNOSIS — K219 Gastro-esophageal reflux disease without esophagitis: Secondary | ICD-10-CM | POA: Diagnosis not present

## 2022-03-07 DIAGNOSIS — R198 Other specified symptoms and signs involving the digestive system and abdomen: Secondary | ICD-10-CM | POA: Diagnosis not present

## 2022-04-02 DIAGNOSIS — N952 Postmenopausal atrophic vaginitis: Secondary | ICD-10-CM | POA: Diagnosis not present

## 2022-05-07 DIAGNOSIS — M25562 Pain in left knee: Secondary | ICD-10-CM | POA: Diagnosis not present

## 2022-05-10 DIAGNOSIS — E7800 Pure hypercholesterolemia, unspecified: Secondary | ICD-10-CM | POA: Diagnosis not present

## 2022-05-10 DIAGNOSIS — R7301 Impaired fasting glucose: Secondary | ICD-10-CM | POA: Diagnosis not present

## 2022-05-10 DIAGNOSIS — E039 Hypothyroidism, unspecified: Secondary | ICD-10-CM | POA: Diagnosis not present

## 2022-05-10 DIAGNOSIS — E559 Vitamin D deficiency, unspecified: Secondary | ICD-10-CM | POA: Diagnosis not present

## 2022-05-10 DIAGNOSIS — Z79899 Other long term (current) drug therapy: Secondary | ICD-10-CM | POA: Diagnosis not present

## 2022-05-14 DIAGNOSIS — Z79899 Other long term (current) drug therapy: Secondary | ICD-10-CM | POA: Diagnosis not present

## 2022-05-14 DIAGNOSIS — Z0001 Encounter for general adult medical examination with abnormal findings: Secondary | ICD-10-CM | POA: Diagnosis not present

## 2022-05-14 DIAGNOSIS — F419 Anxiety disorder, unspecified: Secondary | ICD-10-CM | POA: Diagnosis not present

## 2022-05-14 DIAGNOSIS — E7800 Pure hypercholesterolemia, unspecified: Secondary | ICD-10-CM | POA: Diagnosis not present

## 2022-05-14 DIAGNOSIS — G43109 Migraine with aura, not intractable, without status migrainosus: Secondary | ICD-10-CM | POA: Diagnosis not present

## 2022-05-14 DIAGNOSIS — E039 Hypothyroidism, unspecified: Secondary | ICD-10-CM | POA: Diagnosis not present

## 2022-05-27 DIAGNOSIS — M6281 Muscle weakness (generalized): Secondary | ICD-10-CM | POA: Diagnosis not present

## 2022-05-27 DIAGNOSIS — M5459 Other low back pain: Secondary | ICD-10-CM | POA: Diagnosis not present

## 2022-06-05 DIAGNOSIS — R300 Dysuria: Secondary | ICD-10-CM | POA: Diagnosis not present

## 2022-06-05 DIAGNOSIS — R350 Frequency of micturition: Secondary | ICD-10-CM | POA: Diagnosis not present

## 2022-06-11 DIAGNOSIS — R5383 Other fatigue: Secondary | ICD-10-CM | POA: Diagnosis not present

## 2022-06-11 DIAGNOSIS — E039 Hypothyroidism, unspecified: Secondary | ICD-10-CM | POA: Diagnosis not present

## 2022-06-12 DIAGNOSIS — M6281 Muscle weakness (generalized): Secondary | ICD-10-CM | POA: Diagnosis not present

## 2022-06-12 DIAGNOSIS — M5459 Other low back pain: Secondary | ICD-10-CM | POA: Diagnosis not present

## 2022-06-17 DIAGNOSIS — R5383 Other fatigue: Secondary | ICD-10-CM | POA: Diagnosis not present

## 2022-06-17 DIAGNOSIS — M5459 Other low back pain: Secondary | ICD-10-CM | POA: Diagnosis not present

## 2022-06-17 DIAGNOSIS — K900 Celiac disease: Secondary | ICD-10-CM | POA: Diagnosis not present

## 2022-06-17 DIAGNOSIS — M6281 Muscle weakness (generalized): Secondary | ICD-10-CM | POA: Diagnosis not present

## 2022-06-17 DIAGNOSIS — E039 Hypothyroidism, unspecified: Secondary | ICD-10-CM | POA: Diagnosis not present

## 2022-06-25 DIAGNOSIS — N950 Postmenopausal bleeding: Secondary | ICD-10-CM | POA: Diagnosis not present

## 2022-07-02 DIAGNOSIS — M5459 Other low back pain: Secondary | ICD-10-CM | POA: Diagnosis not present

## 2022-07-02 DIAGNOSIS — M6281 Muscle weakness (generalized): Secondary | ICD-10-CM | POA: Diagnosis not present

## 2022-07-09 DIAGNOSIS — M5459 Other low back pain: Secondary | ICD-10-CM | POA: Diagnosis not present

## 2022-07-09 DIAGNOSIS — M6281 Muscle weakness (generalized): Secondary | ICD-10-CM | POA: Diagnosis not present

## 2022-07-10 DIAGNOSIS — F339 Major depressive disorder, recurrent, unspecified: Secondary | ICD-10-CM | POA: Diagnosis not present

## 2022-07-17 DIAGNOSIS — M5459 Other low back pain: Secondary | ICD-10-CM | POA: Diagnosis not present

## 2022-07-17 DIAGNOSIS — M6281 Muscle weakness (generalized): Secondary | ICD-10-CM | POA: Diagnosis not present

## 2022-07-25 DIAGNOSIS — M5459 Other low back pain: Secondary | ICD-10-CM | POA: Diagnosis not present

## 2022-07-25 DIAGNOSIS — M6281 Muscle weakness (generalized): Secondary | ICD-10-CM | POA: Diagnosis not present

## 2022-07-31 DIAGNOSIS — M6281 Muscle weakness (generalized): Secondary | ICD-10-CM | POA: Diagnosis not present

## 2022-07-31 DIAGNOSIS — M5459 Other low back pain: Secondary | ICD-10-CM | POA: Diagnosis not present

## 2022-08-07 DIAGNOSIS — M5459 Other low back pain: Secondary | ICD-10-CM | POA: Diagnosis not present

## 2022-08-07 DIAGNOSIS — M6281 Muscle weakness (generalized): Secondary | ICD-10-CM | POA: Diagnosis not present

## 2022-08-14 DIAGNOSIS — M5459 Other low back pain: Secondary | ICD-10-CM | POA: Diagnosis not present

## 2022-08-14 DIAGNOSIS — M6281 Muscle weakness (generalized): Secondary | ICD-10-CM | POA: Diagnosis not present

## 2022-08-18 IMAGING — CT CT CARDIAC CORONARY ARTERY CALCIUM SCORE
3 series · 14 of 20 positions shown, 16 images · non-contrast
Comparison: None.
COMPARISON: None.

Addendum:
EXAM:
OVER-READ INTERPRETATION  CT CHEST

The following report is an over-read performed by radiologist Dr.
Marjono Er Es [REDACTED] on 05/14/2021. This
over-read does not include interpretation of cardiac or coronary
anatomy or pathology. The coronary calcium score interpretation by
the cardiologist is attached.
CLINICAL DATA: Cardiovascular Disease Risk stratification
Coronary Calcium Score
TECHNIQUE: A gated, non-contrast computed tomography scan of the heart was
performed using 3mm slice thickness. Axial images were analyzed on a
dedicated workstation. Calcium scoring of the coronary arteries was
performed using the Agatston method.

[Series 3: cascseq 2.0 b35f 70% · axial · 0.36mm/px · z∈[+1288,+1378]mm · 4 of 77 slices shown]
[im 16/77  vessel]
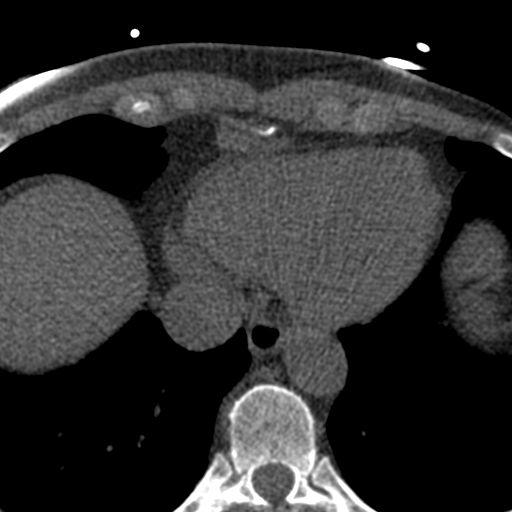
[im 31/77  vessel]
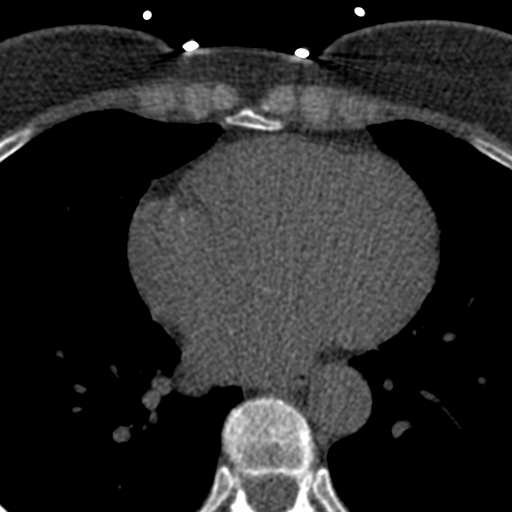
[im 46/77  vessel]
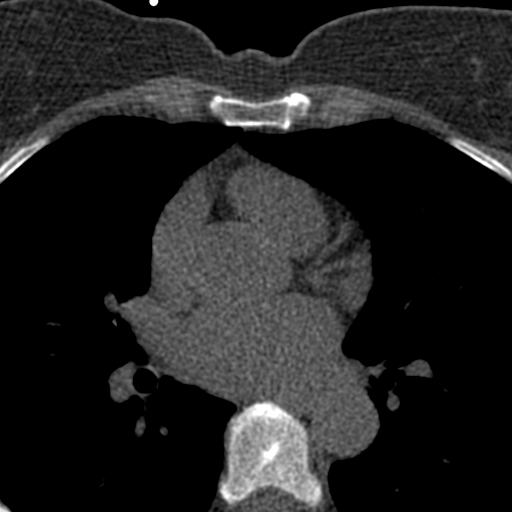
[im 61/77  vessel]
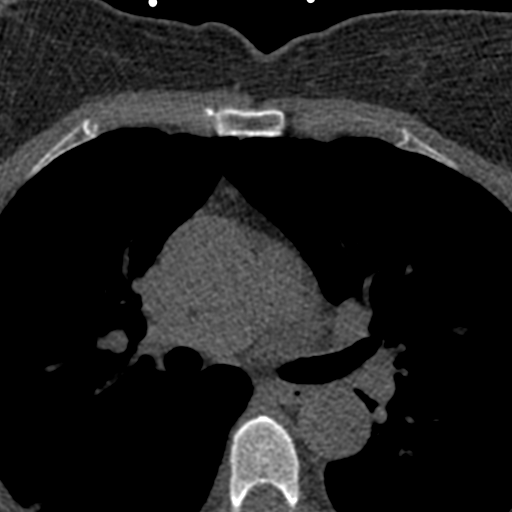

[Series 4: ax st full fov · axial · 0.36mm/px · z∈[+1282,+1384]mm · 5 of 77 slices shown, 7 images (1 of 2)]
[im 13/77  vessel]
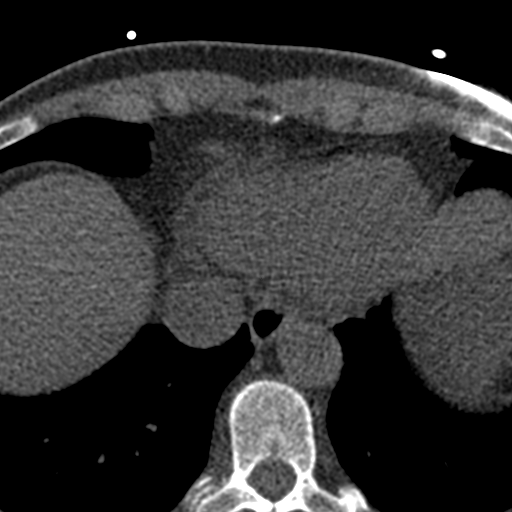
[im 13/77  lung]
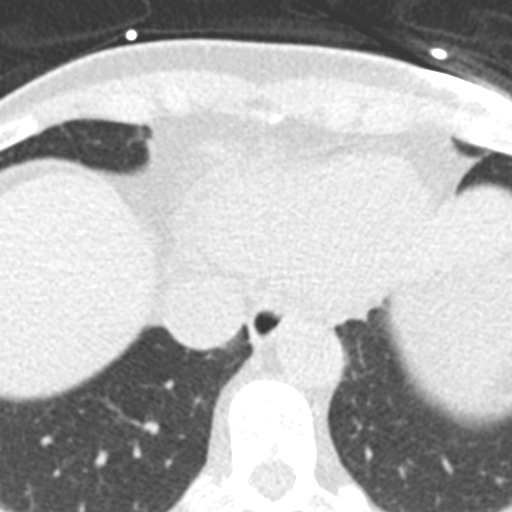
[im 26/77  vessel]
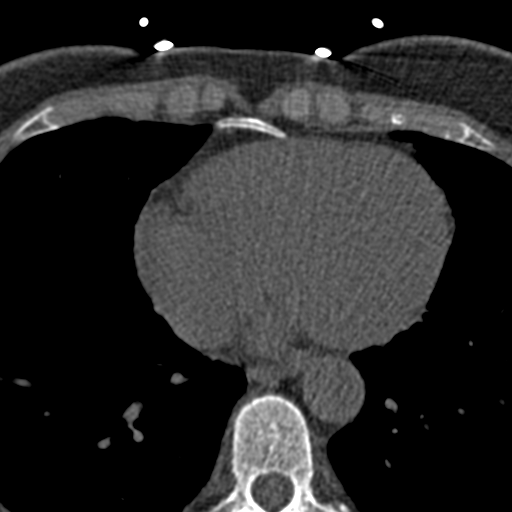
[im 39/77  vessel]
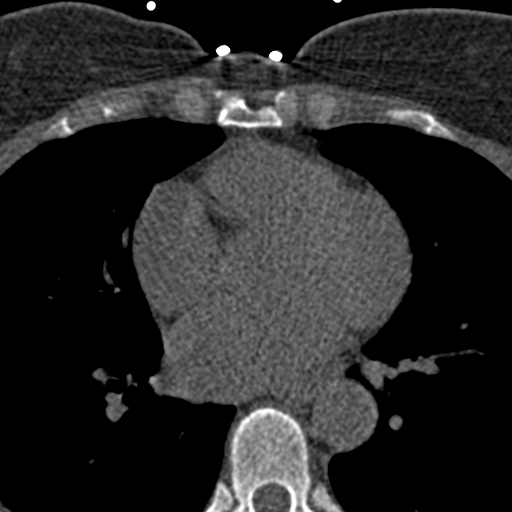
[im 51/77  vessel]
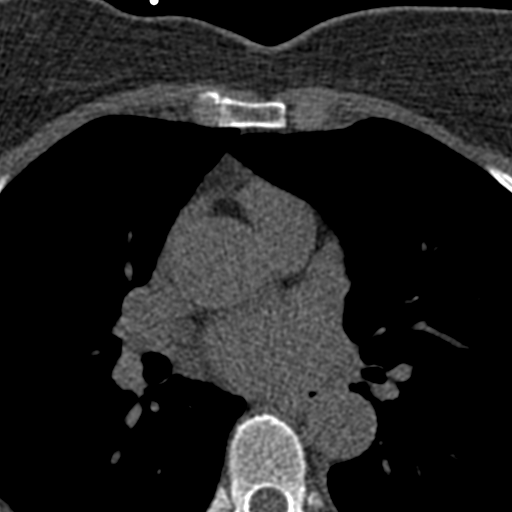
[im 64/77  vessel]
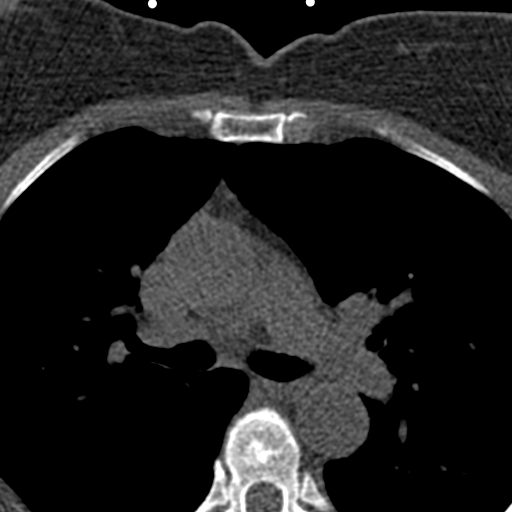
[im 64/77  lung]
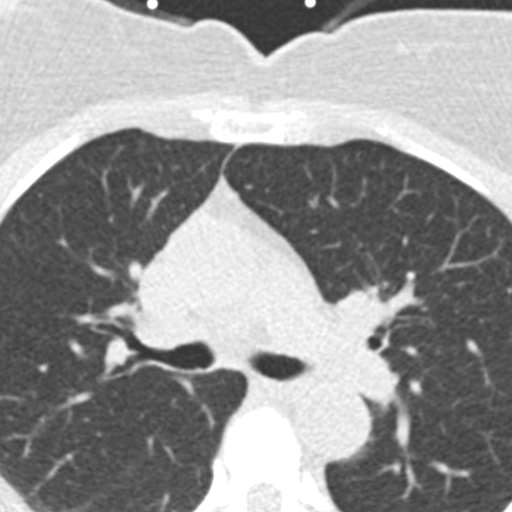

[Series 6: ax st full fov · axial · 0.59mm/px · z∈[+1282,+1384]mm · 5 of 77 slices shown (2 of 2)]
[im 13/77  vessel]
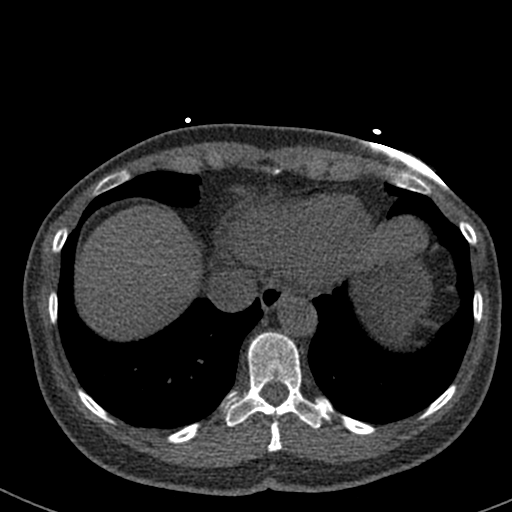
[im 26/77  vessel]
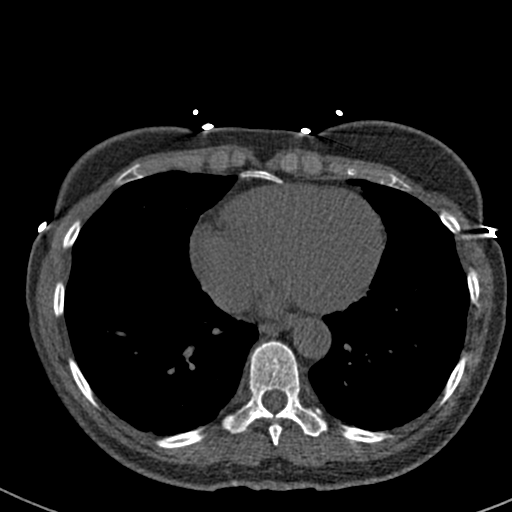
[im 39/77  vessel]
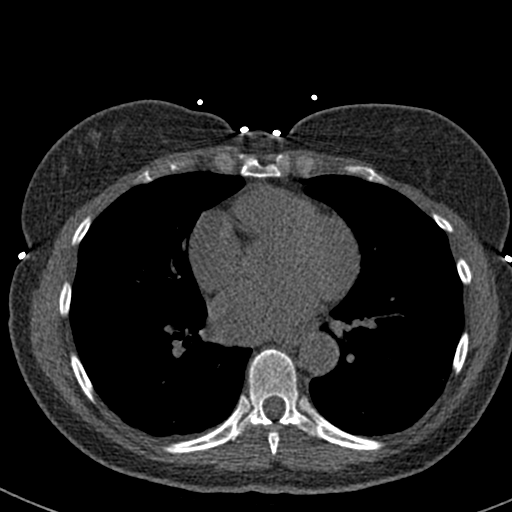
[im 51/77  vessel]
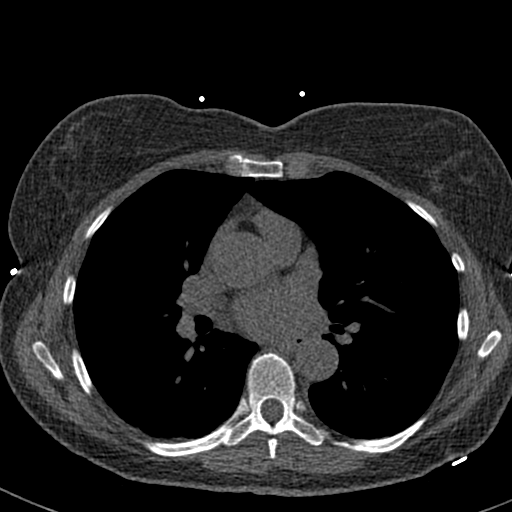
[im 64/77  vessel]
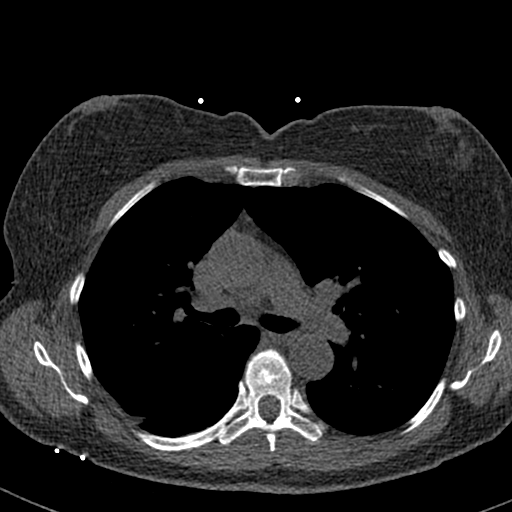

[14 of 20 positions shown; findings below may reference images not displayed]

FINDINGS: Atherosclerotic calcifications in the thoracic aorta. Within the
visualized portions of the thorax there are no suspicious appearing
pulmonary nodules or masses, there is no acute consolidative
airspace disease, no pleural effusions, no pneumothorax and no
lymphadenopathy. Visualized portions of the upper abdomen are
unremarkable. There are no aggressive appearing lytic or blastic
lesions noted in the visualized portions of the skeleton.
IMPRESSION: 1.  Aortic Atherosclerosis (B0QIT-JMJ.J).
FINDINGS: Coronary arteries: Normal origins.

Coronary Calcium Score:

Left main: 0

Left anterior descending artery: 0

Left circumflex artery: 0

Right coronary artery: 0

Total: 0

Pericardium: Normal.

Ascending Aorta: Normal caliber.

Non-cardiac: See separate report from [REDACTED].
IMPRESSION: Coronary calcium score of 0 Agatston units. This suggests low risk
for future cardiac events.



If CAC=0, it is reasonable to withhold statin therapy and reassess
in 5 to 10 years, as long as higher risk conditions are absent
(diabetes mellitus, family history of premature CHD in first degree
relatives (males <55 years; females <65 years), cigarette smoking,
or LDL >=190 mg/dL).

If CAC is 1 to 99, it is reasonable to initiate statin therapy for
patients >=55 years of age.

If CAC is >=100 or >=75th percentile, it is reasonable to initiate
statin therapy at any age.

Cardiology referral should be considered for patients with CAC
scores >=400 or >=75th percentile.

*0864 AHA/ACC/AACVPR/AAPA/ABC/IAN CHRISTIAN/SAMSONAITE/TUNDE/[place]/DILMA/SMITTY/YLAIRIN
Guideline on the Management of Blood Cholesterol: A Report of the
American College of Cardiology/American Heart Association Task Force
on Clinical Practice Guidelines. J Am Coll Cardiol.
3463;73(24):8769-8514.

*** End of Addendum ***
EXAM:
OVER-READ INTERPRETATION  CT CHEST

The following report is an over-read performed by radiologist Dr.
Marjono Er Es [REDACTED] on 05/14/2021. This
over-read does not include interpretation of cardiac or coronary
anatomy or pathology. The coronary calcium score interpretation by
the cardiologist is attached.
FINDINGS: Atherosclerotic calcifications in the thoracic aorta. Within the
visualized portions of the thorax there are no suspicious appearing
pulmonary nodules or masses, there is no acute consolidative
airspace disease, no pleural effusions, no pneumothorax and no
lymphadenopathy. Visualized portions of the upper abdomen are
unremarkable. There are no aggressive appearing lytic or blastic
lesions noted in the visualized portions of the skeleton.
IMPRESSION: 1.  Aortic Atherosclerosis (B0QIT-JMJ.J).

## 2022-08-23 DIAGNOSIS — M6281 Muscle weakness (generalized): Secondary | ICD-10-CM | POA: Diagnosis not present

## 2022-08-23 DIAGNOSIS — M5459 Other low back pain: Secondary | ICD-10-CM | POA: Diagnosis not present

## 2022-09-03 DIAGNOSIS — M5459 Other low back pain: Secondary | ICD-10-CM | POA: Diagnosis not present

## 2022-09-03 DIAGNOSIS — M6281 Muscle weakness (generalized): Secondary | ICD-10-CM | POA: Diagnosis not present

## 2022-09-11 DIAGNOSIS — M6281 Muscle weakness (generalized): Secondary | ICD-10-CM | POA: Diagnosis not present

## 2022-09-11 DIAGNOSIS — M5459 Other low back pain: Secondary | ICD-10-CM | POA: Diagnosis not present

## 2022-09-17 DIAGNOSIS — F339 Major depressive disorder, recurrent, unspecified: Secondary | ICD-10-CM | POA: Diagnosis not present

## 2022-09-19 DIAGNOSIS — M6281 Muscle weakness (generalized): Secondary | ICD-10-CM | POA: Diagnosis not present

## 2022-09-19 DIAGNOSIS — M5459 Other low back pain: Secondary | ICD-10-CM | POA: Diagnosis not present

## 2022-10-04 DIAGNOSIS — M6281 Muscle weakness (generalized): Secondary | ICD-10-CM | POA: Diagnosis not present

## 2022-10-04 DIAGNOSIS — M5459 Other low back pain: Secondary | ICD-10-CM | POA: Diagnosis not present

## 2022-10-10 DIAGNOSIS — F339 Major depressive disorder, recurrent, unspecified: Secondary | ICD-10-CM | POA: Diagnosis not present

## 2022-10-11 DIAGNOSIS — M5459 Other low back pain: Secondary | ICD-10-CM | POA: Diagnosis not present

## 2022-10-11 DIAGNOSIS — M6281 Muscle weakness (generalized): Secondary | ICD-10-CM | POA: Diagnosis not present

## 2022-10-15 DIAGNOSIS — M5459 Other low back pain: Secondary | ICD-10-CM | POA: Diagnosis not present

## 2022-10-15 DIAGNOSIS — M6281 Muscle weakness (generalized): Secondary | ICD-10-CM | POA: Diagnosis not present

## 2022-10-21 DIAGNOSIS — N951 Menopausal and female climacteric states: Secondary | ICD-10-CM | POA: Diagnosis not present

## 2022-10-21 DIAGNOSIS — N952 Postmenopausal atrophic vaginitis: Secondary | ICD-10-CM | POA: Diagnosis not present

## 2022-10-21 DIAGNOSIS — Z6827 Body mass index (BMI) 27.0-27.9, adult: Secondary | ICD-10-CM | POA: Diagnosis not present

## 2022-10-21 DIAGNOSIS — Z1231 Encounter for screening mammogram for malignant neoplasm of breast: Secondary | ICD-10-CM | POA: Diagnosis not present

## 2022-10-21 DIAGNOSIS — Z124 Encounter for screening for malignant neoplasm of cervix: Secondary | ICD-10-CM | POA: Diagnosis not present

## 2022-10-30 ENCOUNTER — Other Ambulatory Visit: Payer: Self-pay | Admitting: Obstetrics and Gynecology

## 2022-10-30 DIAGNOSIS — R928 Other abnormal and inconclusive findings on diagnostic imaging of breast: Secondary | ICD-10-CM

## 2022-11-11 ENCOUNTER — Ambulatory Visit
Admission: RE | Admit: 2022-11-11 | Discharge: 2022-11-11 | Disposition: A | Discharge status: Home / Self Care | Payer: Medicare Other | Source: Ambulatory Visit | Attending: Obstetrics and Gynecology | Admitting: Obstetrics and Gynecology

## 2022-11-11 DIAGNOSIS — R928 Other abnormal and inconclusive findings on diagnostic imaging of breast: Secondary | ICD-10-CM

## 2022-11-11 DIAGNOSIS — N6042 Mammary duct ectasia of left breast: Secondary | ICD-10-CM | POA: Diagnosis not present

## 2022-11-11 DIAGNOSIS — H6123 Impacted cerumen, bilateral: Secondary | ICD-10-CM | POA: Diagnosis not present

## 2022-11-11 DIAGNOSIS — H90A32 Mixed conductive and sensorineural hearing loss, unilateral, left ear with restricted hearing on the contralateral side: Secondary | ICD-10-CM | POA: Diagnosis not present

## 2022-11-11 DIAGNOSIS — R922 Inconclusive mammogram: Secondary | ICD-10-CM | POA: Diagnosis not present

## 2022-12-10 DIAGNOSIS — E039 Hypothyroidism, unspecified: Secondary | ICD-10-CM | POA: Diagnosis not present

## 2022-12-10 DIAGNOSIS — R5383 Other fatigue: Secondary | ICD-10-CM | POA: Diagnosis not present

## 2022-12-17 DIAGNOSIS — L821 Other seborrheic keratosis: Secondary | ICD-10-CM | POA: Diagnosis not present

## 2022-12-17 DIAGNOSIS — Z85828 Personal history of other malignant neoplasm of skin: Secondary | ICD-10-CM | POA: Diagnosis not present

## 2022-12-17 DIAGNOSIS — F339 Major depressive disorder, recurrent, unspecified: Secondary | ICD-10-CM | POA: Diagnosis not present

## 2022-12-17 DIAGNOSIS — L239 Allergic contact dermatitis, unspecified cause: Secondary | ICD-10-CM | POA: Diagnosis not present

## 2022-12-17 DIAGNOSIS — D1801 Hemangioma of skin and subcutaneous tissue: Secondary | ICD-10-CM | POA: Diagnosis not present

## 2022-12-17 DIAGNOSIS — L245 Irritant contact dermatitis due to other chemical products: Secondary | ICD-10-CM | POA: Diagnosis not present

## 2022-12-18 DIAGNOSIS — R5383 Other fatigue: Secondary | ICD-10-CM | POA: Diagnosis not present

## 2022-12-18 DIAGNOSIS — K900 Celiac disease: Secondary | ICD-10-CM | POA: Diagnosis not present

## 2022-12-18 DIAGNOSIS — E039 Hypothyroidism, unspecified: Secondary | ICD-10-CM | POA: Diagnosis not present

## 2022-12-19 DIAGNOSIS — H90A32 Mixed conductive and sensorineural hearing loss, unilateral, left ear with restricted hearing on the contralateral side: Secondary | ICD-10-CM | POA: Diagnosis not present

## 2022-12-26 DIAGNOSIS — Z6827 Body mass index (BMI) 27.0-27.9, adult: Secondary | ICD-10-CM | POA: Diagnosis not present

## 2023-01-07 DIAGNOSIS — F339 Major depressive disorder, recurrent, unspecified: Secondary | ICD-10-CM | POA: Diagnosis not present

## 2023-01-30 DIAGNOSIS — F339 Major depressive disorder, recurrent, unspecified: Secondary | ICD-10-CM | POA: Diagnosis not present

## 2023-02-20 DIAGNOSIS — R5383 Other fatigue: Secondary | ICD-10-CM | POA: Diagnosis not present

## 2023-02-20 DIAGNOSIS — U071 COVID-19: Secondary | ICD-10-CM | POA: Diagnosis not present

## 2023-02-20 DIAGNOSIS — R0981 Nasal congestion: Secondary | ICD-10-CM | POA: Diagnosis not present

## 2023-03-04 DIAGNOSIS — F339 Major depressive disorder, recurrent, unspecified: Secondary | ICD-10-CM | POA: Diagnosis not present

## 2023-03-18 DIAGNOSIS — F339 Major depressive disorder, recurrent, unspecified: Secondary | ICD-10-CM | POA: Diagnosis not present

## 2023-04-08 DIAGNOSIS — F339 Major depressive disorder, recurrent, unspecified: Secondary | ICD-10-CM | POA: Diagnosis not present

## 2023-05-12 DIAGNOSIS — L603 Nail dystrophy: Secondary | ICD-10-CM | POA: Diagnosis not present

## 2023-05-12 DIAGNOSIS — L738 Other specified follicular disorders: Secondary | ICD-10-CM | POA: Diagnosis not present

## 2023-05-12 DIAGNOSIS — L718 Other rosacea: Secondary | ICD-10-CM | POA: Diagnosis not present

## 2023-05-12 DIAGNOSIS — Z85828 Personal history of other malignant neoplasm of skin: Secondary | ICD-10-CM | POA: Diagnosis not present

## 2023-05-12 DIAGNOSIS — D1801 Hemangioma of skin and subcutaneous tissue: Secondary | ICD-10-CM | POA: Diagnosis not present

## 2023-05-13 ENCOUNTER — Ambulatory Visit: Payer: Medicare Other | Admitting: Podiatry

## 2023-05-13 DIAGNOSIS — F339 Major depressive disorder, recurrent, unspecified: Secondary | ICD-10-CM | POA: Diagnosis not present

## 2023-05-19 ENCOUNTER — Ambulatory Visit (INDEPENDENT_AMBULATORY_CARE_PROVIDER_SITE_OTHER): Payer: Medicare Other | Admitting: Podiatry

## 2023-05-19 DIAGNOSIS — M21619 Bunion of unspecified foot: Secondary | ICD-10-CM

## 2023-05-19 DIAGNOSIS — L603 Nail dystrophy: Secondary | ICD-10-CM | POA: Diagnosis not present

## 2023-05-19 DIAGNOSIS — L600 Ingrowing nail: Secondary | ICD-10-CM

## 2023-05-19 NOTE — Patient Instructions (Signed)
Ingrown Toenail  An ingrown toenail occurs when the corner or sides of a toenail grow into the surrounding skin. This causes discomfort and pain. The big toe is most commonly affected, but any of the toes can be affected. If an ingrown toenail is not treated, it can become infected. What are the causes? This condition may be caused by: Wearing shoes that are too small or tight. An injury, such as stubbing your toe or having your toe stepped on. Improper cutting or care of your toenails. Having nail or foot abnormalities that were present from birth (congenital abnormalities), such as having a nail that is too big for your toe. What increases the risk? The following factors may make you more likely to develop ingrown toenails: Age. Nails tend to get thicker with age, so ingrown nails are more common among older people. Cutting your toenails incorrectly, such as cutting them very short or cutting them unevenly. An ingrown toenail is more likely to get infected if you have: Diabetes. Blood flow (circulation) problems. What are the signs or symptoms? Symptoms of an ingrown toenail may include: Pain, soreness, or tenderness. Redness. Swelling. Hardening of the skin that surrounds the toenail. Signs that an ingrown toenail may be infected include: Fluid or pus. Symptoms that get worse. How is this diagnosed? Ingrown toenails may be diagnosed based on: Your symptoms and medical history. A physical exam. Labs or tests. If you have fluid or blood coming from your toenail, a sample may be collected to test for the specific type of bacteria that is causing the infection. How is this treated? Treatment depends on the severity of your symptoms. You may be able to care for your toenail at home. If you have an infection, you may be prescribed antibiotic medicines. If you have fluid or pus draining from your toenail, your health care provider may drain it. If you have trouble walking, you may be  given crutches to use. If you have a severe or infected ingrown toenail, you may need a procedure to remove part or all of the nail. Follow these instructions at home: Foot care  Check your wound every day for signs of infection, or as often as told by your health care provider. Check for: More redness, swelling, or pain. More fluid or blood. Warmth. Pus or a bad smell. Do not pick at your toenail or try to remove it yourself. Soak your foot in warm, soapy water. Do this for 20 minutes, 3 times a day, or as often as told by your health care provider. This helps to keep your toe clean and your skin soft. Wear shoes that fit well and are not too tight. Your health care provider may recommend that you wear open-toed shoes while you heal. Trim your toenails regularly and carefully. Cut your toenails straight across to prevent injury to the skin at the corners of the toenail. Do not cut your nails in a curved shape. Keep your feet clean and dry to help prevent infection. General instructions Take over-the-counter and prescription medicines only as told by your health care provider. If you were prescribed an antibiotic, take it as told by your health care provider. Do not stop taking the antibiotic even if you start to feel better. If your health care provider told you to use crutches to help you move around, use them as instructed. Return to your normal activities as told by your health care provider. Ask your health care provider what activities are safe for you.  Keep all follow-up visits. This is important. Contact a health care provider if: You have more redness, swelling, pain, or other symptoms that do not improve with treatment. You have fluid, blood, or pus coming from your toenail. You have a red streak on your skin that starts at your foot and spreads up your leg. You have a fever. Summary An ingrown toenail occurs when the corner or sides of a toenail grow into the surrounding skin.  This causes discomfort and pain. The big toe is most commonly affected, but any of the toes can be affected. If an ingrown toenail is not treated, it can become infected. Fluid or pus draining from your toenail is a sign of infection. Your health care provider may need to drain it. You may be given antibiotics to treat the infection. Trimming your toenails regularly and properly can help you prevent an ingrown toenail. This information is not intended to replace advice given to you by your health care provider. Make sure you discuss any questions you have with your health care provider. Document Revised: 09/26/2020 Document Reviewed: 09/26/2020 Elsevier Patient Education  2024 Elsevier Inc. Bunion A bunion (hallux valgus) is a bump that forms slowly on the inner side of the big toe joint. It occurs when the big toe turns toward the second toe. Bunions may be small at first, but they often get larger over time. They can make walking painful. What are the causes? This condition may be caused by: Wearing narrow or pointed shoes that force the big toe to press against the other toes. Abnormal foot development that causes the foot to roll inward. Changes in the foot that are caused by certain diseases, such as rheumatoid arthritis or polio. A foot injury. What increases the risk? The following factors may make you more likely to develop this condition: Wearing shoes that squeeze the toes together. Having certain diseases, such as: Rheumatoid arthritis. Polio. Cerebral palsy. Having family members who have bunions. Being born with abnormally shaped feet (a foot deformity), such as flat feet or low arches. Doing activities that put a lot of pressure on the feet, such as ballet dancing. What are the signs or symptoms?  The main symptom of this condition is a bump on your big toe that you can notice. Other symptoms may include: Pain. Redness and inflammation around your big toe. Thick or  hardened skin on your big toe or between your toes. Stiffness or loss of motion in your big toe. Trouble with walking. How is this diagnosed? This condition may be diagnosed based on your symptoms, medical history, and activities. You may also have tests and imaging, such as: X-rays. These allow your health care provider to check the position of the bones in your foot and look for damage to your joint. They also help your health care provider determine the severity of your bunion and the best way to treat it. Joint aspiration. In this test, a sample of fluid is removed from the toe joint. This test may be done if you are in a lot of pain. It helps rule out diseases that cause painful swelling of the joints, such as arthritis or gout. How is this treated? Treatment depends on the severity of your symptoms. The goal of treatment is to relieve symptoms and prevent your bunion from getting worse. Your health care provider may recommend: Wearing shoes that have a wide toe box, or using bunion pads to cushion the affected area. Taping your toes together to  keep them in a normal position. Placing a device inside your shoe (orthotic device) to help reduce pressure on your toe joint. Taking medicine to ease pain and inflammation. Putting ice or heat on the affected area. Doing stretching exercises. Surgery, for severe cases. Follow these instructions at home: Managing pain, stiffness, and swelling     If directed, put ice on the painful area. To do this: Put ice in a plastic bag. Place a towel between your skin and the bag. Leave the ice on for 20 minutes, 2-3 times a day. Remove the ice if your skin turns bright red. This is very important. If you cannot feel pain, heat, or cold, you have a greater risk of damage to the area. If directed, apply heat to the affected area before you exercise. Use the heat source that your health care provider recommends, such as a moist heat pack or a heating  pad. Place a towel between your skin and the heat source. Leave the heat on for 20-30 minutes. Remove the heat if your skin turns bright red. This is especially important if you are unable to feel pain, heat, or cold. You have a greater risk of getting burned. General instructions Do exercises as told by your health care provider. Support your toe joint with proper footwear, shoe padding, or taping as told by your health care provider. Take over-the-counter and prescription medicines only as told by your health care provider. Do not use any products that contain nicotine or tobacco, such as cigarettes, e-cigarettes, and chewing tobacco. If you need help quitting, ask your health care provider. Keep all follow-up visits. This is important. Contact a health care provider if: Your symptoms get worse. Your symptoms do not improve in 2 weeks. Get help right away if: You have severe pain and trouble with walking. Summary A bunion is a bump on the inner side of the big toe joint that forms when the big toe turns toward the second toe. Bunions can make walking painful. Treatment depends on the severity of your symptoms. Support your toe joint with proper footwear, shoe padding, or taping as told by your health care provider. This information is not intended to replace advice given to you by your health care provider. Make sure you discuss any questions you have with your health care provider. Document Revised: 09/30/2019 Document Reviewed: 10/01/2019 Elsevier Patient Education  2024 [employer].

## 2023-05-19 NOTE — Progress Notes (Unsigned)
Subjective: Chief Complaint  Patient presents with   Nail Problem    RM#11 Patient states has left big toe nail pain and has some questions on big toe nails.Also wants to discuss shoe.    72 year old female presents the office today for concerns of ingrown toenails left big toe which she also gets pain on her bunions.  She also has  "lines" on her right big toenail. She is not sure if this is from the injury but she does not report any pain.  She does not report any drainage or pus coming from the toenail sites and does get tender with pressure of the nailbed elongated.  She is not quite ready to have the nail removed or bunion surgery. No recent injuries.  Objective: AAO x3, NAD DP/PT pulses palpable bilaterally, CRT less than 3 seconds Bunion present in the bilateral with the right side worse than left.  Suggested using tenderness palpation on the medial eminence of the first metatarsal head right foot. Three is no pain with MTPJ range of motion.  No hypermobility.  Mild incurvation present on the medial aspect left hallux toenail.  There is no edema, erythema or signs of infection.  Tenderness present distal portion of the toenail. Transverse ridging noted on the central one half of the right hallux toenail without any edema, erythema or signs of infection. No pain with calf compression, swelling, warmth, erythema  Assessment: 72 year old female with hyperkeratotic lesions, bunion, hammertoe; onychodystrophy  Plan:  Ingrown toenail left hallux -Discussed partial nail avulsion which was a follow-up on this today.  Sharply debrided corn without any complications or bleeding.  Bunion -Discussed the conservative as well as surgical options.  She does not want proceed with surgery at this time.  Discussed shoe modifications, swelling and padding.  Dispensed bunion pads today.  Onychodystrophy right hallux toenail -It appears the nail is growing out. -Can use urea nail gel.  Also discussed  topical antifungal medication.  Lindsay Munoz DPM

## 2023-05-26 DIAGNOSIS — E559 Vitamin D deficiency, unspecified: Secondary | ICD-10-CM | POA: Diagnosis not present

## 2023-05-26 DIAGNOSIS — R7301 Impaired fasting glucose: Secondary | ICD-10-CM | POA: Diagnosis not present

## 2023-05-26 DIAGNOSIS — Z79899 Other long term (current) drug therapy: Secondary | ICD-10-CM | POA: Diagnosis not present

## 2023-05-26 DIAGNOSIS — E039 Hypothyroidism, unspecified: Secondary | ICD-10-CM | POA: Diagnosis not present

## 2023-05-26 DIAGNOSIS — E538 Deficiency of other specified B group vitamins: Secondary | ICD-10-CM | POA: Diagnosis not present

## 2023-05-29 DIAGNOSIS — H90A32 Mixed conductive and sensorineural hearing loss, unilateral, left ear with restricted hearing on the contralateral side: Secondary | ICD-10-CM | POA: Diagnosis not present

## 2023-05-29 DIAGNOSIS — E559 Vitamin D deficiency, unspecified: Secondary | ICD-10-CM | POA: Diagnosis not present

## 2023-05-29 DIAGNOSIS — Z0001 Encounter for general adult medical examination with abnormal findings: Secondary | ICD-10-CM | POA: Diagnosis not present

## 2023-05-29 DIAGNOSIS — F419 Anxiety disorder, unspecified: Secondary | ICD-10-CM | POA: Diagnosis not present

## 2023-05-29 DIAGNOSIS — Z79899 Other long term (current) drug therapy: Secondary | ICD-10-CM | POA: Diagnosis not present

## 2023-05-29 DIAGNOSIS — H6121 Impacted cerumen, right ear: Secondary | ICD-10-CM | POA: Diagnosis not present

## 2023-05-29 DIAGNOSIS — Z1331 Encounter for screening for depression: Secondary | ICD-10-CM | POA: Diagnosis not present

## 2023-05-29 DIAGNOSIS — E039 Hypothyroidism, unspecified: Secondary | ICD-10-CM | POA: Diagnosis not present

## 2023-05-29 DIAGNOSIS — R7301 Impaired fasting glucose: Secondary | ICD-10-CM | POA: Diagnosis not present

## 2023-05-29 DIAGNOSIS — E538 Deficiency of other specified B group vitamins: Secondary | ICD-10-CM | POA: Diagnosis not present

## 2023-06-24 DIAGNOSIS — M79644 Pain in right finger(s): Secondary | ICD-10-CM | POA: Diagnosis not present

## 2023-07-01 DIAGNOSIS — E039 Hypothyroidism, unspecified: Secondary | ICD-10-CM | POA: Diagnosis not present

## 2023-07-01 DIAGNOSIS — R5383 Other fatigue: Secondary | ICD-10-CM | POA: Diagnosis not present

## 2023-07-02 DIAGNOSIS — F339 Major depressive disorder, recurrent, unspecified: Secondary | ICD-10-CM | POA: Diagnosis not present

## 2023-07-10 DIAGNOSIS — M79675 Pain in left toe(s): Secondary | ICD-10-CM | POA: Diagnosis not present

## 2023-07-11 DIAGNOSIS — E039 Hypothyroidism, unspecified: Secondary | ICD-10-CM | POA: Diagnosis not present

## 2023-07-11 DIAGNOSIS — R7301 Impaired fasting glucose: Secondary | ICD-10-CM | POA: Diagnosis not present

## 2023-07-15 DIAGNOSIS — F339 Major depressive disorder, recurrent, unspecified: Secondary | ICD-10-CM | POA: Diagnosis not present

## 2023-07-16 DIAGNOSIS — K900 Celiac disease: Secondary | ICD-10-CM | POA: Diagnosis not present

## 2023-07-16 DIAGNOSIS — R7301 Impaired fasting glucose: Secondary | ICD-10-CM | POA: Diagnosis not present

## 2023-07-22 DIAGNOSIS — M79644 Pain in right finger(s): Secondary | ICD-10-CM | POA: Diagnosis not present

## 2023-07-23 DIAGNOSIS — H2513 Age-related nuclear cataract, bilateral: Secondary | ICD-10-CM | POA: Diagnosis not present

## 2023-07-29 DIAGNOSIS — E039 Hypothyroidism, unspecified: Secondary | ICD-10-CM | POA: Diagnosis not present

## 2023-07-29 DIAGNOSIS — M7989 Other specified soft tissue disorders: Secondary | ICD-10-CM | POA: Diagnosis not present

## 2023-08-05 DIAGNOSIS — F339 Major depressive disorder, recurrent, unspecified: Secondary | ICD-10-CM | POA: Diagnosis not present

## 2023-08-06 DIAGNOSIS — L821 Other seborrheic keratosis: Secondary | ICD-10-CM | POA: Diagnosis not present

## 2023-08-06 DIAGNOSIS — Z85828 Personal history of other malignant neoplasm of skin: Secondary | ICD-10-CM | POA: Diagnosis not present

## 2023-08-22 DIAGNOSIS — M79642 Pain in left hand: Secondary | ICD-10-CM | POA: Diagnosis not present

## 2023-08-22 DIAGNOSIS — E063 Autoimmune thyroiditis: Secondary | ICD-10-CM | POA: Diagnosis not present

## 2023-08-22 DIAGNOSIS — R5383 Other fatigue: Secondary | ICD-10-CM | POA: Diagnosis not present

## 2023-08-22 DIAGNOSIS — Z6827 Body mass index (BMI) 27.0-27.9, adult: Secondary | ICD-10-CM | POA: Diagnosis not present

## 2023-08-22 DIAGNOSIS — M064 Inflammatory polyarthropathy: Secondary | ICD-10-CM | POA: Diagnosis not present

## 2023-08-22 DIAGNOSIS — M1991 Primary osteoarthritis, unspecified site: Secondary | ICD-10-CM | POA: Diagnosis not present

## 2023-08-22 DIAGNOSIS — E663 Overweight: Secondary | ICD-10-CM | POA: Diagnosis not present

## 2023-08-22 DIAGNOSIS — M79641 Pain in right hand: Secondary | ICD-10-CM | POA: Diagnosis not present

## 2023-09-10 DIAGNOSIS — F339 Major depressive disorder, recurrent, unspecified: Secondary | ICD-10-CM | POA: Diagnosis not present

## 2023-09-12 DIAGNOSIS — M064 Inflammatory polyarthropathy: Secondary | ICD-10-CM | POA: Diagnosis not present

## 2023-09-12 DIAGNOSIS — M79642 Pain in left hand: Secondary | ICD-10-CM | POA: Diagnosis not present

## 2023-09-12 DIAGNOSIS — M1991 Primary osteoarthritis, unspecified site: Secondary | ICD-10-CM | POA: Diagnosis not present

## 2023-09-12 DIAGNOSIS — E663 Overweight: Secondary | ICD-10-CM | POA: Diagnosis not present

## 2023-09-12 DIAGNOSIS — Z6827 Body mass index (BMI) 27.0-27.9, adult: Secondary | ICD-10-CM | POA: Diagnosis not present

## 2023-09-12 DIAGNOSIS — M130 Polyarthritis, unspecified: Secondary | ICD-10-CM | POA: Diagnosis not present

## 2023-09-12 DIAGNOSIS — M79641 Pain in right hand: Secondary | ICD-10-CM | POA: Diagnosis not present

## 2023-09-16 ENCOUNTER — Encounter: Payer: Self-pay | Admitting: Podiatry

## 2023-09-16 ENCOUNTER — Ambulatory Visit (INDEPENDENT_AMBULATORY_CARE_PROVIDER_SITE_OTHER): Admitting: Podiatry

## 2023-09-16 DIAGNOSIS — M21619 Bunion of unspecified foot: Secondary | ICD-10-CM | POA: Diagnosis not present

## 2023-09-16 DIAGNOSIS — L600 Ingrowing nail: Secondary | ICD-10-CM | POA: Diagnosis not present

## 2023-09-16 MED ORDER — CICLOPIROX 8 % EX SOLN: Freq: Every day | CUTANEOUS | 2 refills | Status: AC

## 2023-09-16 NOTE — Patient Instructions (Signed)
 You can also use UREA NAIL GEL on the 5th digit toenails to help with the thickness  I also sent over [facility]  to the pharmacy to help.   ---  Ciclopirox Topical Solution What is this medication? CICLOPIROX (sye kloe PEER ox) treats fungal infections of the nails. It belongs to a group of medications called antifungals. It will not treat infections caused by bacteria or viruses. This medicine may be used for other purposes; ask your health care provider or pharmacist if you have questions. COMMON BRAND NAME(S): Ciclodan Nail Solution, CNL8, Penlac What should I tell my care team before I take this medication? They need to know if you have any of these conditions: Diabetes (high blood sugar) Immune system problems Organ transplant Receiving steroid inhalers, cream, or lotion Seizures Tingling of the fingers or toes or other nerve disorder An unusual or allergic reaction to ciclopirox, other medications, foods, dyes, or preservatives Pregnant or trying to get pregnant Breast-feeding How should I use this medication? This medication is for external use only. Do not take by mouth. Wash your hands before and after use. If you are treating your hands, only wash your hands before use. Do not get it in your eyes. If you do, rinse your eyes with plenty of cool tap water. Use it as directed on the prescription label at the same time every day. Do not use it more often than directed. Use the medication for the full course as directed by your care team, even if you think you are better. Do not stop using it unless your care team tells you to stop it early. Apply a thin film of the medication to the affected area. Talk to your care team about the use of this medication in children. While it may be prescribed for children as young as 12 years for selected conditions, precautions do apply. Overdosage: If you think you have taken too much of this medicine contact a poison control center or emergency room at  once. NOTE: This medicine is only for you. Do not share this medicine with others. What if I miss a dose? If you miss a dose, use it as soon as you can. If it is almost time for your next dose, use only that dose. Do not use double or extra doses. What may interact with this medication? Interactions are not expected. Do not use any other skin products without telling your care team. This list may not describe all possible interactions. Give your health care provider a list of all the medicines, herbs, non-prescription drugs, or dietary supplements you use. Also tell them if you smoke, drink alcohol, or use illegal drugs. Some items may interact with your medicine. What should I watch for while using this medication? Visit your care team for regular checks on your progress. It may be some time before you see the benefit from this medication. Do not use nail polish or other nail cosmetic products on the treated nails. Removal of the unattached, infected nail by your care team is needed with use of this medication. If you have diabetes or numbness in your fingers or toes, talk to your care team about proper nail care. What side effects may I notice from receiving this medication? Side effects that you should report to your care team as soon as possible: Allergic reactions--skin rash, itching, hives, swelling of the face, lips, tongue, or throat Burning, itching, crusting, or peeling of treated skin Side effects that usually do not require medical  attention (report to your care team if they continue or are bothersome): Change in nail shape, thickness, or color Mild skin irritation, redness, or dryness This list may not describe all possible side effects. Call your doctor for medical advice about side effects. You may report side effects to FDA at 1-800-FDA-1088. Where should I keep my medication? Keep out of the reach of children and pets. Store at room temperature between 20 and 25 degrees C (68  and 77 degrees F). This medication is flammable. Avoid exposure to heat, fire, flame, and smoking. Get rid of medications that are no longer needed or have expired: Take the medication to a medication take-back program. Check with your pharmacy or law enforcement to find a location. If you cannot return the medication, check the label or package insert to see if the medication should be thrown out in the garbage or flushed down the toilet. If you are not sure, ask your care team. If it is safe to put in the trash, take the medication out of the container. Mix the medication with cat litter, dirt, coffee grounds, or other unwanted substance. Seal the mixture in a bag or container. Put it in the trash. NOTE: This sheet is a summary. It may not cover all possible information. If you have questions about this medicine, talk to your doctor, pharmacist, or health care provider.  2024 Elsevier/Gold Standard (2021-09-24 00:00:00)

## 2023-09-17 DIAGNOSIS — R7301 Impaired fasting glucose: Secondary | ICD-10-CM | POA: Diagnosis not present

## 2023-09-17 DIAGNOSIS — K900 Celiac disease: Secondary | ICD-10-CM | POA: Diagnosis not present

## 2023-09-18 NOTE — Progress Notes (Signed)
 Chief Complaint  Patient presents with   Nail Problem    RM#11 Left foot big toe ingrown nail causing pain again patient states needs it trimming.    73 year old female presents the office today for concerns of ingrown toenails left big toe.  States that it was hurting but is no longer hurting at this time.  Denies any swelling or redness or any drainage.  No injuries or other concerns.   Objective: AAO x3, NAD DP/PT pulses palpable bilaterally, CRT less than 3 seconds Bunion present in the bilateral with the right side worse than left.  Minimal incurvation present left hallux toenail without any edema, erythema, drainage or purulence and no signs of infection noted.  No open lesions. No areas of pinpoint tenderness. No pain with calf compression, swelling, warmth, erythema  Assessment: 73 year old female with ingrown toenail  Plan:  Ingrown toenail left hallux -Discussed partial nail avulsion which she will to hold off on this today.  As a courtesy I debrided the corn without any complications or bleeding.  Monitoring signs or symptoms of infection or reoccurrence.  Again discussed that if symptoms persist partial nail avulsion.   Bunion -Discussed the conservative as well as surgical options.  She does not want proceed with surgery at this time.  Discussed shoe modifications, swelling and padding.  Dispensed bunion pads today  Vivi Barrack DPM

## 2023-10-07 DIAGNOSIS — E039 Hypothyroidism, unspecified: Secondary | ICD-10-CM | POA: Diagnosis not present

## 2023-10-07 DIAGNOSIS — M7989 Other specified soft tissue disorders: Secondary | ICD-10-CM | POA: Diagnosis not present

## 2023-10-07 DIAGNOSIS — R768 Other specified abnormal immunological findings in serum: Secondary | ICD-10-CM | POA: Diagnosis not present

## 2023-10-08 DIAGNOSIS — E039 Hypothyroidism, unspecified: Secondary | ICD-10-CM | POA: Diagnosis not present

## 2023-10-17 DIAGNOSIS — F339 Major depressive disorder, recurrent, unspecified: Secondary | ICD-10-CM | POA: Diagnosis not present

## 2023-10-22 DIAGNOSIS — Z1231 Encounter for screening mammogram for malignant neoplasm of breast: Secondary | ICD-10-CM | POA: Diagnosis not present

## 2023-10-22 DIAGNOSIS — Z124 Encounter for screening for malignant neoplasm of cervix: Secondary | ICD-10-CM | POA: Diagnosis not present

## 2023-10-22 DIAGNOSIS — Z6829 Body mass index (BMI) 29.0-29.9, adult: Secondary | ICD-10-CM | POA: Diagnosis not present

## 2023-10-28 DIAGNOSIS — L820 Inflamed seborrheic keratosis: Secondary | ICD-10-CM | POA: Diagnosis not present

## 2023-10-28 DIAGNOSIS — Z85828 Personal history of other malignant neoplasm of skin: Secondary | ICD-10-CM | POA: Diagnosis not present

## 2023-11-05 DIAGNOSIS — R7301 Impaired fasting glucose: Secondary | ICD-10-CM | POA: Diagnosis not present

## 2023-11-05 DIAGNOSIS — E039 Hypothyroidism, unspecified: Secondary | ICD-10-CM | POA: Diagnosis not present

## 2023-11-08 DIAGNOSIS — E039 Hypothyroidism, unspecified: Secondary | ICD-10-CM | POA: Diagnosis not present

## 2023-11-12 DIAGNOSIS — E039 Hypothyroidism, unspecified: Secondary | ICD-10-CM | POA: Diagnosis not present

## 2023-11-12 DIAGNOSIS — K900 Celiac disease: Secondary | ICD-10-CM | POA: Diagnosis not present

## 2023-11-12 DIAGNOSIS — R7301 Impaired fasting glucose: Secondary | ICD-10-CM | POA: Diagnosis not present

## 2023-11-12 DIAGNOSIS — R5383 Other fatigue: Secondary | ICD-10-CM | POA: Diagnosis not present

## 2023-11-12 DIAGNOSIS — R0683 Snoring: Secondary | ICD-10-CM | POA: Diagnosis not present

## 2023-11-13 DIAGNOSIS — F339 Major depressive disorder, recurrent, unspecified: Secondary | ICD-10-CM | POA: Diagnosis not present

## 2023-12-01 DIAGNOSIS — H6123 Impacted cerumen, bilateral: Secondary | ICD-10-CM | POA: Diagnosis not present

## 2023-12-03 DIAGNOSIS — K900 Celiac disease: Secondary | ICD-10-CM | POA: Diagnosis not present

## 2023-12-03 DIAGNOSIS — R7301 Impaired fasting glucose: Secondary | ICD-10-CM | POA: Diagnosis not present

## 2023-12-05 DIAGNOSIS — F339 Major depressive disorder, recurrent, unspecified: Secondary | ICD-10-CM | POA: Diagnosis not present

## 2023-12-08 DIAGNOSIS — E039 Hypothyroidism, unspecified: Secondary | ICD-10-CM | POA: Diagnosis not present

## 2023-12-18 DIAGNOSIS — M79643 Pain in unspecified hand: Secondary | ICD-10-CM | POA: Diagnosis not present

## 2023-12-18 DIAGNOSIS — M1990 Unspecified osteoarthritis, unspecified site: Secondary | ICD-10-CM | POA: Diagnosis not present

## 2023-12-18 DIAGNOSIS — M7989 Other specified soft tissue disorders: Secondary | ICD-10-CM | POA: Diagnosis not present

## 2023-12-18 DIAGNOSIS — M2550 Pain in unspecified joint: Secondary | ICD-10-CM | POA: Diagnosis not present

## 2023-12-18 DIAGNOSIS — E039 Hypothyroidism, unspecified: Secondary | ICD-10-CM | POA: Diagnosis not present

## 2023-12-18 DIAGNOSIS — F419 Anxiety disorder, unspecified: Secondary | ICD-10-CM | POA: Diagnosis not present

## 2023-12-19 DIAGNOSIS — F339 Major depressive disorder, recurrent, unspecified: Secondary | ICD-10-CM | POA: Diagnosis not present

## 2023-12-22 ENCOUNTER — Ambulatory Visit: Admitting: Podiatry

## 2024-01-01 ENCOUNTER — Encounter: Payer: Self-pay | Admitting: Podiatry

## 2024-01-01 ENCOUNTER — Ambulatory Visit (INDEPENDENT_AMBULATORY_CARE_PROVIDER_SITE_OTHER): Admitting: Podiatry

## 2024-01-01 DIAGNOSIS — L600 Ingrowing nail: Secondary | ICD-10-CM

## 2024-01-01 DIAGNOSIS — L603 Nail dystrophy: Secondary | ICD-10-CM

## 2024-01-01 NOTE — Patient Instructions (Signed)
 Ingrown Toenail  An ingrown toenail occurs when the corner or sides of a toenail grow into the surrounding skin. This causes discomfort and pain. The big toe is most commonly affected, but any of the toes can be affected. If an ingrown toenail is not treated, it can become infected. What are the causes? This condition may be caused by: Wearing shoes that are too small or tight. An injury, such as stubbing your toe or having your toe stepped on. Improper cutting or care of your toenails. Having nail or foot abnormalities that were present from birth (congenital abnormalities), such as having a nail that is too big for your toe. What increases the risk? The following factors may make you more likely to develop ingrown toenails: Age. Nails tend to get thicker with age, so ingrown nails are more common among older people. Cutting your toenails incorrectly, such as cutting them very short or cutting them unevenly. An ingrown toenail is more likely to get infected if you have: Diabetes. Blood flow (circulation) problems. What are the signs or symptoms? Symptoms of an ingrown toenail may include: Pain, soreness, or tenderness. Redness. Swelling. Hardening of the skin that surrounds the toenail. Signs that an ingrown toenail may be infected include: Fluid or pus. Symptoms that get worse. How is this diagnosed? Ingrown toenails may be diagnosed based on: Your symptoms and medical history. A physical exam. Labs or tests. If you have fluid or blood coming from your toenail, a sample may be collected to test for the specific type of bacteria that is causing the infection. How is this treated? Treatment depends on the severity of your symptoms. You may be able to care for your toenail at home. If you have an infection, you may be prescribed antibiotic medicines. If you have fluid or pus draining from your toenail, your health care provider may drain it. If you have trouble walking, you may be  given crutches to use. If you have a severe or infected ingrown toenail, you may need a procedure to remove part or all of the nail. Follow these instructions at home: Foot care  Check your wound every day for signs of infection, or as often as told by your health care provider. Check for: More redness, swelling, or pain. More fluid or blood. Warmth. Pus or a bad smell. Do not pick at your toenail or try to remove it yourself. Soak your foot in warm, soapy water. Do this for 20 minutes, 3 times a day, or as often as told by your health care provider. This helps to keep your toe clean and your skin soft. Wear shoes that fit well and are not too tight. Your health care provider may recommend that you wear open-toed shoes while you heal. Trim your toenails regularly and carefully. Cut your toenails straight across to prevent injury to the skin at the corners of the toenail. Do not cut your nails in a curved shape. Keep your feet clean and dry to help prevent infection. General instructions Take over-the-counter and prescription medicines only as told by your health care provider. If you were prescribed an antibiotic, take it as told by your health care provider. Do not stop taking the antibiotic even if you start to feel better. If your health care provider told you to use crutches to help you move around, use them as instructed. Return to your normal activities as told by your health care provider. Ask your health care provider what activities are safe for you.  Keep all follow-up visits. This is important. Contact a health care provider if: You have more redness, swelling, pain, or other symptoms that do not improve with treatment. You have fluid, blood, or pus coming from your toenail. You have a red streak on your skin that starts at your foot and spreads up your leg. You have a fever. Summary An ingrown toenail occurs when the corner or sides of a toenail grow into the surrounding skin.  This causes discomfort and pain. The big toe is most commonly affected, but any of the toes can be affected. If an ingrown toenail is not treated, it can become infected. Fluid or pus draining from your toenail is a sign of infection. Your health care provider may need to drain it. You may be given antibiotics to treat the infection. Trimming your toenails regularly and properly can help you prevent an ingrown toenail. This information is not intended to replace advice given to you by your health care provider. Make sure you discuss any questions you have with your health care provider. Document Revised: 09/26/2020 Document Reviewed: 09/26/2020 Elsevier Patient Education  2024 [employer].

## 2024-01-01 NOTE — Progress Notes (Signed)
 Chief Complaint  Patient presents with   Nail Problem    Rm11 Patient has concerns about bilateral great toenails/3 months/pt does not want to have nails removed.     73 year old female presents the office today for concerns of ingrown toenails left big toe.  States that after he was trimmed last time it caused soreness.  She was to have the nails checked she does not want the ingrown toenail removed today.  She does not report any drainage.  She also does not want to use different medications for her toenails.   Objective: AAO x3, NAD DP/PT pulses palpable bilaterally, CRT less than 3 seconds Bunion present in the bilateral with the right side worse than left.  Minimal incurvation present left hallux toenail without any edema, erythema, drainage or purulence and no signs of infection noted.  No open lesions.  Nails are mildly hypertrophic, dystrophic with yellow-brown coloration.  There is some edema along the right hallux toenail. No areas of pinpoint tenderness. No pain with calf compression, swelling, warmth, erythema  Assessment: 73 year old female with ingrown toenail  Plan:  Ingrown toenail left hallux/nail dystrophy -Discussed partial nail avulsion which she will to hold off on this today.  - Discussed treatment options for nail fungus.  She does not want to use prescription medication.  She is going to use tea tree oil.  We discussed the nail strengthening help with the split right hallux toenail.  Monitor for any signs or symptoms of infection.  No follow-ups on file.  Lindsay Munoz DPM

## 2024-01-08 DIAGNOSIS — E039 Hypothyroidism, unspecified: Secondary | ICD-10-CM | POA: Diagnosis not present

## 2024-01-09 DIAGNOSIS — F339 Major depressive disorder, recurrent, unspecified: Secondary | ICD-10-CM | POA: Diagnosis not present

## 2024-01-30 DIAGNOSIS — F339 Major depressive disorder, recurrent, unspecified: Secondary | ICD-10-CM | POA: Diagnosis not present

## 2024-02-04 DIAGNOSIS — R7301 Impaired fasting glucose: Secondary | ICD-10-CM | POA: Diagnosis not present

## 2024-02-08 DIAGNOSIS — E039 Hypothyroidism, unspecified: Secondary | ICD-10-CM | POA: Diagnosis not present

## 2024-03-09 DIAGNOSIS — E039 Hypothyroidism, unspecified: Secondary | ICD-10-CM | POA: Diagnosis not present

## 2024-03-12 DIAGNOSIS — F339 Major depressive disorder, recurrent, unspecified: Secondary | ICD-10-CM | POA: Diagnosis not present

## 2024-03-23 DIAGNOSIS — H9319 Tinnitus, unspecified ear: Secondary | ICD-10-CM | POA: Diagnosis not present

## 2024-03-23 DIAGNOSIS — R5383 Other fatigue: Secondary | ICD-10-CM | POA: Diagnosis not present

## 2024-03-23 DIAGNOSIS — Z79899 Other long term (current) drug therapy: Secondary | ICD-10-CM | POA: Diagnosis not present

## 2024-03-23 DIAGNOSIS — Z111 Encounter for screening for respiratory tuberculosis: Secondary | ICD-10-CM | POA: Diagnosis not present

## 2024-03-23 DIAGNOSIS — H6122 Impacted cerumen, left ear: Secondary | ICD-10-CM | POA: Diagnosis not present

## 2024-03-23 DIAGNOSIS — E039 Hypothyroidism, unspecified: Secondary | ICD-10-CM | POA: Diagnosis not present

## 2024-03-25 DIAGNOSIS — R42 Dizziness and giddiness: Secondary | ICD-10-CM | POA: Diagnosis not present

## 2024-03-25 DIAGNOSIS — H90A32 Mixed conductive and sensorineural hearing loss, unilateral, left ear with restricted hearing on the contralateral side: Secondary | ICD-10-CM | POA: Diagnosis not present

## 2024-03-25 DIAGNOSIS — H6121 Impacted cerumen, right ear: Secondary | ICD-10-CM | POA: Diagnosis not present

## 2024-04-02 ENCOUNTER — Ambulatory Visit: Admitting: Podiatry

## 2024-04-02 DIAGNOSIS — F339 Major depressive disorder, recurrent, unspecified: Secondary | ICD-10-CM | POA: Diagnosis not present

## 2024-04-05 ENCOUNTER — Ambulatory Visit: Admitting: Podiatry

## 2024-04-08 DIAGNOSIS — H903 Sensorineural hearing loss, bilateral: Secondary | ICD-10-CM | POA: Diagnosis not present

## 2024-04-09 DIAGNOSIS — E039 Hypothyroidism, unspecified: Secondary | ICD-10-CM | POA: Diagnosis not present

## 2024-04-20 DIAGNOSIS — Z79899 Other long term (current) drug therapy: Secondary | ICD-10-CM | POA: Diagnosis not present

## 2024-04-20 DIAGNOSIS — R42 Dizziness and giddiness: Secondary | ICD-10-CM | POA: Diagnosis not present

## 2024-04-20 DIAGNOSIS — E039 Hypothyroidism, unspecified: Secondary | ICD-10-CM | POA: Diagnosis not present

## 2024-04-26 DIAGNOSIS — H8112 Benign paroxysmal vertigo, left ear: Secondary | ICD-10-CM | POA: Diagnosis not present

## 2024-04-28 DIAGNOSIS — H8112 Benign paroxysmal vertigo, left ear: Secondary | ICD-10-CM | POA: Diagnosis not present

## 2024-05-03 DIAGNOSIS — H8112 Benign paroxysmal vertigo, left ear: Secondary | ICD-10-CM | POA: Diagnosis not present

## 2024-05-05 DIAGNOSIS — H8112 Benign paroxysmal vertigo, left ear: Secondary | ICD-10-CM | POA: Diagnosis not present

## 2024-05-09 DIAGNOSIS — E039 Hypothyroidism, unspecified: Secondary | ICD-10-CM | POA: Diagnosis not present

## 2024-05-12 DIAGNOSIS — H8112 Benign paroxysmal vertigo, left ear: Secondary | ICD-10-CM | POA: Diagnosis not present

## 2024-05-14 DIAGNOSIS — H8112 Benign paroxysmal vertigo, left ear: Secondary | ICD-10-CM | POA: Diagnosis not present

## 2024-05-17 DIAGNOSIS — H8112 Benign paroxysmal vertigo, left ear: Secondary | ICD-10-CM | POA: Diagnosis not present

## 2024-05-18 DIAGNOSIS — R5383 Other fatigue: Secondary | ICD-10-CM | POA: Diagnosis not present

## 2024-05-18 DIAGNOSIS — E039 Hypothyroidism, unspecified: Secondary | ICD-10-CM | POA: Diagnosis not present

## 2024-05-18 DIAGNOSIS — R7301 Impaired fasting glucose: Secondary | ICD-10-CM | POA: Diagnosis not present

## 2024-05-19 DIAGNOSIS — H8112 Benign paroxysmal vertigo, left ear: Secondary | ICD-10-CM | POA: Diagnosis not present

## 2024-05-21 DIAGNOSIS — F419 Anxiety disorder, unspecified: Secondary | ICD-10-CM | POA: Diagnosis not present

## 2024-05-21 DIAGNOSIS — K900 Celiac disease: Secondary | ICD-10-CM | POA: Diagnosis not present

## 2024-05-21 DIAGNOSIS — M1990 Unspecified osteoarthritis, unspecified site: Secondary | ICD-10-CM | POA: Diagnosis not present

## 2024-05-21 DIAGNOSIS — R7301 Impaired fasting glucose: Secondary | ICD-10-CM | POA: Diagnosis not present

## 2024-05-21 DIAGNOSIS — R5383 Other fatigue: Secondary | ICD-10-CM | POA: Diagnosis not present

## 2024-05-21 DIAGNOSIS — E039 Hypothyroidism, unspecified: Secondary | ICD-10-CM | POA: Diagnosis not present

## 2024-05-24 DIAGNOSIS — H8112 Benign paroxysmal vertigo, left ear: Secondary | ICD-10-CM | POA: Diagnosis not present

## 2024-05-26 DIAGNOSIS — H8112 Benign paroxysmal vertigo, left ear: Secondary | ICD-10-CM | POA: Diagnosis not present
